# Patient Record
Sex: Male | Born: 1935 | Race: White | Hispanic: No | Smoking: Former smoker
Health system: Southern US, Community
[De-identification: ages and names within clinical notes are randomized; demographics above are authoritative.]

## PROBLEM LIST (undated history)

## (undated) DIAGNOSIS — F419 Anxiety disorder, unspecified: Secondary | ICD-10-CM

## (undated) DIAGNOSIS — G20A1 Parkinson's disease without dyskinesia, without mention of fluctuations: Secondary | ICD-10-CM

## (undated) DIAGNOSIS — M8580 Other specified disorders of bone density and structure, unspecified site: Secondary | ICD-10-CM

## (undated) DIAGNOSIS — N289 Disorder of kidney and ureter, unspecified: Secondary | ICD-10-CM

## (undated) DIAGNOSIS — G20 Parkinson's disease: Secondary | ICD-10-CM

## (undated) DIAGNOSIS — M5416 Radiculopathy, lumbar region: Secondary | ICD-10-CM

## (undated) DIAGNOSIS — I509 Heart failure, unspecified: Secondary | ICD-10-CM

## (undated) DIAGNOSIS — I10 Essential (primary) hypertension: Secondary | ICD-10-CM

## (undated) HISTORY — PX: ROTATOR CUFF REPAIR: SHX139

## (undated) HISTORY — DX: Radiculopathy, lumbar region: M54.16

## (undated) HISTORY — PX: CHOLECYSTECTOMY: SHX55

## (undated) HISTORY — DX: Other specified disorders of bone density and structure, unspecified site: M85.80

## (undated) HISTORY — PX: OTHER SURGICAL HISTORY: SHX169

---

## 1997-08-14 ENCOUNTER — Other Ambulatory Visit: Admission: RE | Admit: 1997-08-14 | Discharge: 1997-08-14 | Payer: Self-pay | Admitting: Internal Medicine

## 1998-03-26 ENCOUNTER — Ambulatory Visit (HOSPITAL_COMMUNITY): Admission: RE | Admit: 1998-03-26 | Discharge: 1998-03-26 | Payer: Self-pay | Admitting: Orthopedic Surgery

## 1998-03-26 ENCOUNTER — Encounter: Payer: Self-pay | Admitting: Orthopedic Surgery

## 1998-04-19 ENCOUNTER — Encounter: Payer: Self-pay | Admitting: Orthopedic Surgery

## 1998-04-23 ENCOUNTER — Inpatient Hospital Stay (HOSPITAL_COMMUNITY): Admission: RE | Admit: 1998-04-23 | Discharge: 1998-04-25 | Payer: Self-pay | Admitting: Orthopedic Surgery

## 2000-07-23 ENCOUNTER — Encounter: Admission: RE | Admit: 2000-07-23 | Discharge: 2000-07-23 | Payer: Self-pay | Admitting: Internal Medicine

## 2000-07-23 ENCOUNTER — Encounter: Payer: Self-pay | Admitting: Internal Medicine

## 2003-02-02 ENCOUNTER — Encounter: Admission: RE | Admit: 2003-02-02 | Discharge: 2003-02-02 | Payer: Self-pay | Admitting: Orthopaedic Surgery

## 2004-02-18 ENCOUNTER — Ambulatory Visit (HOSPITAL_COMMUNITY): Admission: RE | Admit: 2004-02-18 | Discharge: 2004-02-18 | Payer: Self-pay | Admitting: Neurology

## 2004-03-07 ENCOUNTER — Ambulatory Visit (HOSPITAL_COMMUNITY): Admission: RE | Admit: 2004-03-07 | Discharge: 2004-03-07 | Payer: Self-pay | Admitting: Neurology

## 2005-06-17 ENCOUNTER — Ambulatory Visit (HOSPITAL_COMMUNITY): Admission: RE | Admit: 2005-06-17 | Discharge: 2005-06-17 | Payer: Self-pay | Admitting: Neurology

## 2007-11-21 ENCOUNTER — Encounter: Admission: RE | Admit: 2007-11-21 | Discharge: 2007-12-30 | Payer: Self-pay | Admitting: Neurology

## 2008-07-05 ENCOUNTER — Encounter: Admission: RE | Admit: 2008-07-05 | Discharge: 2008-07-05 | Payer: Self-pay | Admitting: Geriatric Medicine

## 2008-07-10 ENCOUNTER — Encounter: Admission: RE | Admit: 2008-07-10 | Discharge: 2008-07-10 | Payer: Self-pay | Admitting: Geriatric Medicine

## 2008-07-26 ENCOUNTER — Ambulatory Visit (HOSPITAL_COMMUNITY): Admission: RE | Admit: 2008-07-26 | Discharge: 2008-07-26 | Payer: Self-pay | Admitting: Urology

## 2008-10-29 ENCOUNTER — Encounter (INDEPENDENT_AMBULATORY_CARE_PROVIDER_SITE_OTHER): Payer: Self-pay | Admitting: Surgery

## 2008-10-29 ENCOUNTER — Ambulatory Visit (HOSPITAL_COMMUNITY): Admission: RE | Admit: 2008-10-29 | Discharge: 2008-10-30 | Payer: Self-pay | Admitting: Surgery

## 2010-04-27 IMAGING — CR DG CHEST 2V
2 series · 2 of 2 positions shown · non-contrast
Comparison: None

CLINICAL DATA: Preop gallstones.

CHEST - 2 VIEW

[w chest pa]
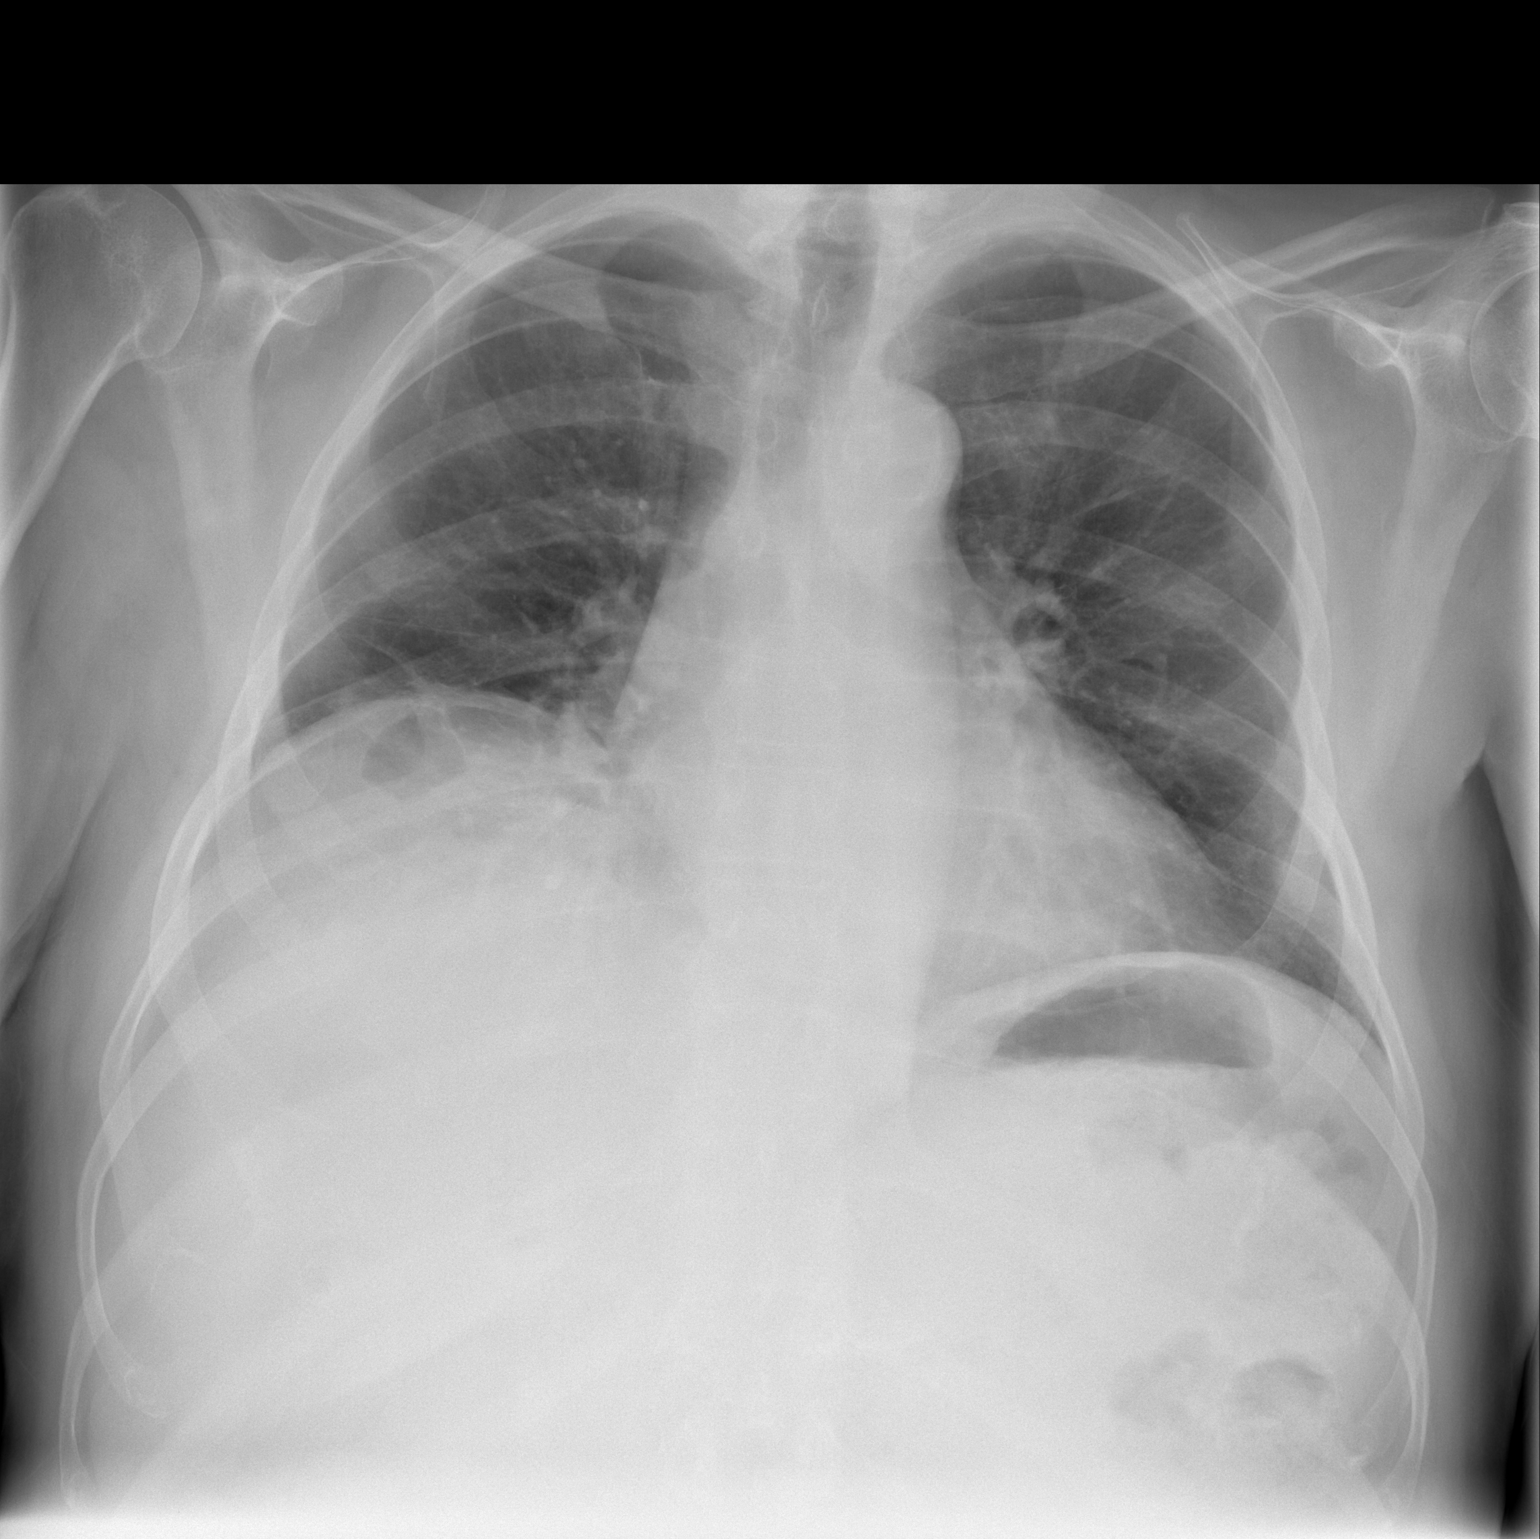

[w chest lat]
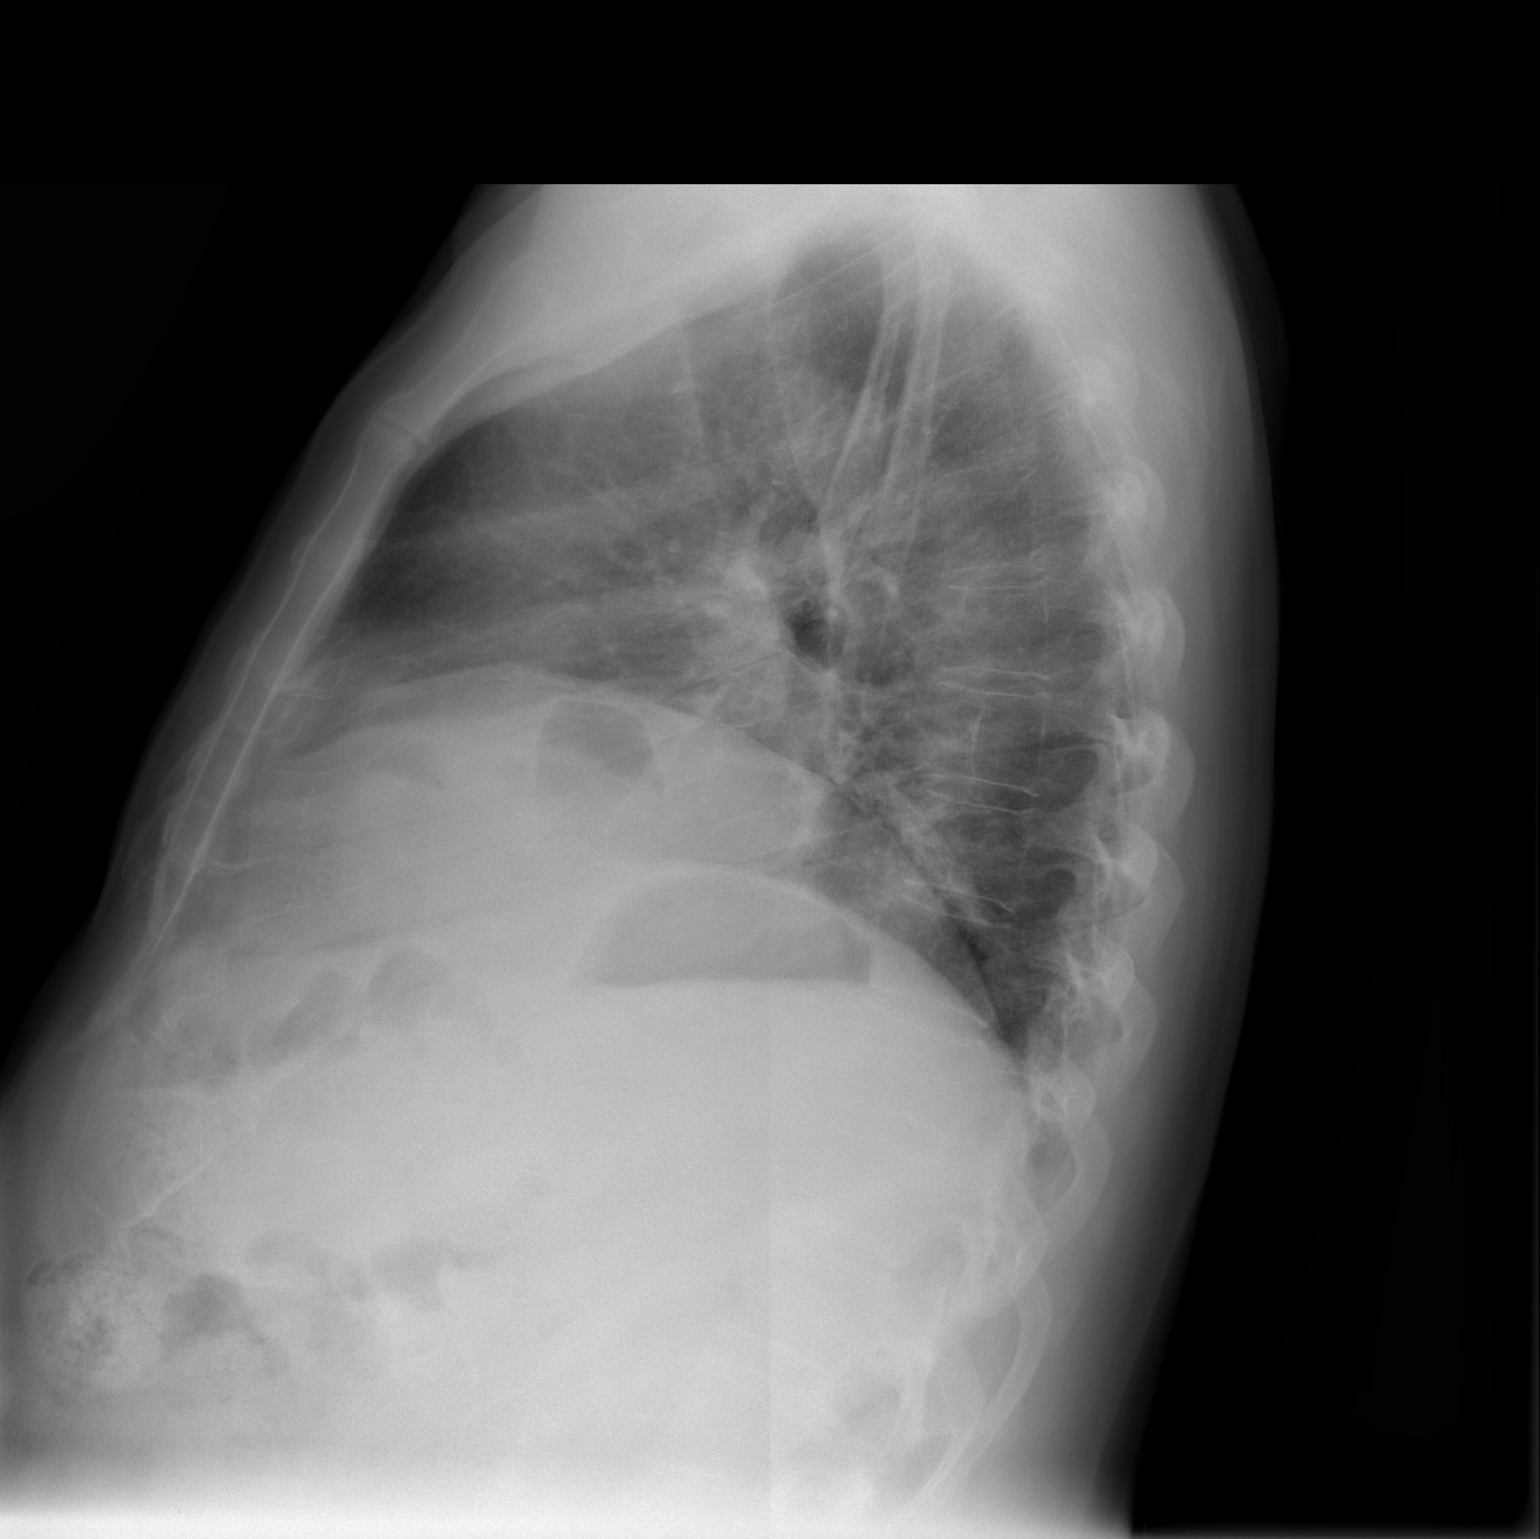

[2 of 2 positions shown; findings below may reference images not displayed]

FINDINGS: There are low lung volumes.  Mild elevation of the right
hemidiaphragm with right base atelectasis.  Heart is mildly
enlarged.  No effusions or acute bony abnormality.
IMPRESSION: Low lung volumes, right base atelectasis.

Mild cardiomegaly.

## 2010-07-13 LAB — COMPREHENSIVE METABOLIC PANEL
AST: 20 U/L (ref 0–37)
Albumin: 3.8 g/dL (ref 3.5–5.2)
Chloride: 101 mEq/L (ref 96–112)
Creatinine, Ser: 1.35 mg/dL (ref 0.4–1.5)
GFR calc Af Amer: >60 mL/min (ref >60)
Total Bilirubin: 0.5 mg/dL (ref 0.3–1.2)
Total Protein: 7.1 g/dL (ref 6.0–8.3)

## 2010-07-13 LAB — HEMOGLOBIN A1C
Hgb A1c MFr Bld: 7.6 % — ABNORMAL HIGH (ref 4.6–6.1)
Mean Plasma Glucose: 171 mg/dL

## 2010-07-13 LAB — DIFFERENTIAL
Eosinophils Relative: 2 % (ref 0–5)
Lymphocytes Relative: 17 % (ref 12–46)
Lymphs Abs: 1.4 10*3/uL (ref 0.7–4.0)
Monocytes Absolute: 0.5 10*3/uL (ref 0.1–1.0)
Monocytes Relative: 6 % (ref 3–12)

## 2010-07-13 LAB — GLUCOSE, CAPILLARY
Glucose-Capillary: 217 mg/dL — ABNORMAL HIGH (ref 70–99)
Glucose-Capillary: 233 mg/dL — ABNORMAL HIGH (ref 70–99)

## 2010-07-13 LAB — CBC
MCV: 85.5 fL (ref 78.0–100.0)
Platelets: 259 10*3/uL (ref 150–400)
RDW: 15.6 % — ABNORMAL HIGH (ref 11.5–15.5)
WBC: 8 10*3/uL (ref 4.0–10.5)

## 2010-07-16 LAB — CREATININE, SERUM
Creatinine, Ser: 1.26 mg/dL (ref 0.4–1.5)
GFR calc non Af Amer: 55 mL/min — ABNORMAL LOW (ref >60)

## 2010-08-19 NOTE — Op Note (Signed)
Andrew Dunn, Andrew Dunn               ACCOUNT NO.:  [id number]   MEDICAL RECORD NO.:  [id number]          PATIENT TYPE:  OIB   LOCATION:  0098                         FACILITY:  [facility]   PHYSICIAN:  Clovis Pu. Cornett, M.D.DATE OF BIRTH:  11/01/1925   DATE OF PROCEDURE:  10/29/2008  DATE OF DISCHARGE:                               OPERATIVE REPORT   PREOPERATIVE DIAGNOSIS:  Symptomatic cholelithiasis.   PROCEDURE:  Laparoscopic cholecystectomy with intraoperative  cholangiogram.   SURGEON:  Harriette Bouillon, MD.   ASSISTANT:  OR staff.   ANESTHESIA:  General endotracheal anesthesia with 0.25% Sensorcaine  local.   ESTIMATED BLOOD LOSS:  50 mL.   DRAINS:  None.   SPECIMEN:  Gallbladder with large gallstone to pathology.   INDICATIONS FOR PROCEDURE:  The patient is an 77 year old male with  symptomatic cholelithiasis.  He presents for laparoscopic  cholecystectomy due to chronic attacks of pain.   DESCRIPTION OF PROCEDURE:  The patient was brought to the operating room  and placed supine.  After induction of general anesthesia the abdomen  was prepped and draped in sterile fashion.  A 1-cm supraumbilical  incision was made.  Dissection was carried down to his fascia and the  fascia was opened with a scalpel blade.  Kochers were used to hold the  fascia.  This was the preperitoneal space and I used a small Kelly clamp  which spread up the peritoneum to enter the abdominal cavity under  direct vision.  A pursestring suture of 0-Vicryl was placed and a 12-mm  Hassan cannula was placed under direct vision.  Pneumoperitoneum was  created with 15 mmHg CO2 and laparoscope was placed.  The patient was  placed in reverse Trendelenburg and rolled to his left.  He had a very  large omentum and colon; this was up over the liver.  It took some  manipulation to pull this down to visualize the liver edge which we  eventually did.  An 11-mm subxiphoid port was placed under direct vision  and two  5-mL ports were placed in the right upper quadrant without  difficulty.  Gallbladder was identified and was quite small and had  signs of chronic inflammation.  We grabbed it by its dome and tried to  push it above the liver edge.  Unfortunately due to large amount of  mesenteric fat we could not see it and so I placed an additional left  midabdominal 5-mm port and placed a small grasper so I could pull down  on the omentum, thus exposing the infundibulum much better.  I was able  to grab the infundibulum, but the gallbladder was very thin and paper-  like toward the infundibulum, spilling a small amount of bile but no  stones.  I was able to grab the gallbladder and pull toward the  patient's right lower quadrant and dissect away the cystic artery and  subsequent cystic duct.  Two clips were placed in the bottom part of  cystic artery and a clip was placed above; this was divided.  This  helped [place] to see the cystic duct much better.  I was able to get  completely around this and get a clip on the gallbladder side.  A small  incision was made in the cystic duct and the cholangiogram catheter was  introduced through it.  Intraoperative cholangiogram was performed using  0.5% Hypaque dye and fluoroscopy.  We had a small amount of gallbladder  just below where our catheter was and a very long tortuous cystic duct  that flowed into the common duct with free flow of contrast in the  duodenum up the common hepatic duct into right and left ductules.  No  evidence of stones or small extravasation from the catheter insertion  site.  I removed the catheter at this point and slid my clips below it  get the very bottom portion of what appeared to be a little bit of  retained gallbladder across the true cystic duct.  I then excised this  excess tissue above the clips with the specimen as the gallbladder.  We  then used cautery to dissect the gallbladder and gallbladder fossa.  His  liver was quite soft  and there was some oozing from liver bed.  After  extraction of the gallbladder from the liver bed this was placed in an  EndoCatch bag.  We used cautery to control this with Surgicel to control  some oozing from liver edge and we were able to do so.  There was a  small posterior branch of the cystic artery that I controlled with clips  as well.  After irrigation was used and the Surgicel was in place the  gallbladder was extracted through the umbilicus and passed off the  field.  Closure of this port site was done under direct vision with a  pursestring suture of 0-Vicryl.  I reinspected the gallbladder bed and  saw no evidence of bleeding or bile leakage.  We saw no evidence of  bleeding or bile leakage.  We saw no injury to the colon, small bowel,  stomach, and is no signs of any bleeding from the port sites.  At this  point in time after checking the gallbladder we removed our ports,  allowing the CO2 to escape.  All ports were passed off the field.  We  close skin incisions with 4-0 Monocryl and Dermabond was applied.  All  final counts of sponges, needles and instruments were found to correct  at this portion case.  The patient was awakened and taken to the  recovery room in satisfactory condition.      Thomas A. Cornett, M.D.  Electronically Signed     TAC/MEDQ  D:  10/29/2008  T:  10/29/2008  Job:  [id number]

## 2010-08-22 NOTE — Op Note (Signed)
NAMEABELINO, TIPPIN               ACCOUNT NO.:  [id number]   MEDICAL RECORD NO.:  [id number]          PATIENT TYPE:  OUT   LOCATION:  MDC                          FACILITY:  [facility]   PHYSICIAN:  Genene Churn. Love, M.D.    DATE OF BIRTH:  [birth year]   DATE OF PROCEDURE:  03/07/2004  DATE OF DISCHARGE:                                 OPERATIVE REPORT   PROCEDURE:  Patient was prepped and draped in the usual sterile manner with  Betadine and 1% Xylocaine. The L4-L5 interspace was entered without  difficulty.  Entering pressure was 100 mmH20 and clear color CSF was  obtained.  Thirty-five cc was removed.  The patient had a timed stand, walk,  sit, gait between 2 chairs, 20 feet apart.  The time before was 148 seconds,  the time afterwards was 2 minutes.   IMPRESSION:  No improvement as the result of spinal tap.       JML/MEDQ  D:  03/07/2004  T:  03/07/2004  Job:  [id number]

## 2010-11-25 ENCOUNTER — Other Ambulatory Visit: Payer: Self-pay | Admitting: Internal Medicine

## 2010-11-25 DIAGNOSIS — M5416 Radiculopathy, lumbar region: Secondary | ICD-10-CM

## 2010-11-27 ENCOUNTER — Ambulatory Visit
Admission: RE | Admit: 2010-11-27 | Discharge: 2010-11-27 | Disposition: A | Discharge status: Home / Self Care | Payer: Medicare Other | Source: Ambulatory Visit | Attending: Internal Medicine | Admitting: Internal Medicine

## 2010-11-27 DIAGNOSIS — M5416 Radiculopathy, lumbar region: Secondary | ICD-10-CM

## 2011-04-13 ENCOUNTER — Other Ambulatory Visit: Payer: Self-pay

## 2011-04-13 ENCOUNTER — Encounter: Payer: Self-pay | Admitting: Adult Health

## 2011-04-13 ENCOUNTER — Emergency Department (HOSPITAL_COMMUNITY): Payer: Medicare Other

## 2011-04-13 ENCOUNTER — Emergency Department (HOSPITAL_COMMUNITY)
Admission: EM | Admit: 2011-04-13 | Discharge: 2011-04-13 | Disposition: A | Discharge status: Home / Self Care | Payer: Medicare Other | Attending: Emergency Medicine | Admitting: Emergency Medicine

## 2011-04-13 DIAGNOSIS — I6789 Other cerebrovascular disease: Secondary | ICD-10-CM | POA: Insufficient documentation

## 2011-04-13 DIAGNOSIS — F0280 Dementia in other diseases classified elsewhere without behavioral disturbance: Secondary | ICD-10-CM | POA: Insufficient documentation

## 2011-04-13 DIAGNOSIS — G3183 Dementia with Lewy bodies: Secondary | ICD-10-CM | POA: Insufficient documentation

## 2011-04-13 DIAGNOSIS — R51 Headache: Secondary | ICD-10-CM | POA: Insufficient documentation

## 2011-04-13 DIAGNOSIS — I10 Essential (primary) hypertension: Secondary | ICD-10-CM | POA: Insufficient documentation

## 2011-04-13 DIAGNOSIS — Y92009 Unspecified place in unspecified non-institutional (private) residence as the place of occurrence of the external cause: Secondary | ICD-10-CM | POA: Insufficient documentation

## 2011-04-13 DIAGNOSIS — Q619 Cystic kidney disease, unspecified: Secondary | ICD-10-CM | POA: Insufficient documentation

## 2011-04-13 DIAGNOSIS — E119 Type 2 diabetes mellitus without complications: Secondary | ICD-10-CM | POA: Insufficient documentation

## 2011-04-13 DIAGNOSIS — R319 Hematuria, unspecified: Secondary | ICD-10-CM | POA: Insufficient documentation

## 2011-04-13 DIAGNOSIS — W19XXXA Unspecified fall, initial encounter: Secondary | ICD-10-CM | POA: Insufficient documentation

## 2011-04-13 DIAGNOSIS — R109 Unspecified abdominal pain: Secondary | ICD-10-CM | POA: Insufficient documentation

## 2011-04-13 DIAGNOSIS — Z9089 Acquired absence of other organs: Secondary | ICD-10-CM | POA: Insufficient documentation

## 2011-04-13 DIAGNOSIS — T1490XA Injury, unspecified, initial encounter: Secondary | ICD-10-CM | POA: Insufficient documentation

## 2011-04-13 HISTORY — DX: Parkinson's disease: G20

## 2011-04-13 HISTORY — DX: Essential (primary) hypertension: I10

## 2011-04-13 HISTORY — DX: Anxiety disorder, unspecified: F41.9

## 2011-04-13 HISTORY — DX: Disorder of kidney and ureter, unspecified: N28.9

## 2011-04-13 HISTORY — DX: Parkinson's disease without dyskinesia, without mention of fluctuations: G20.A1

## 2011-04-13 LAB — CBC
HCT: 35 % — ABNORMAL LOW (ref 39.0–52.0)
MCH: 27.1 pg (ref 26.0–34.0)
MCHC: 32.9 g/dL (ref 30.0–36.0)
RDW: 15 % (ref 11.5–15.5)

## 2011-04-13 LAB — BASIC METABOLIC PANEL
CO2: 30 mEq/L (ref 19–32)
Calcium: 9 mg/dL (ref 8.4–10.5)
Creatinine, Ser: 1.36 mg/dL — ABNORMAL HIGH (ref 0.50–1.35)
GFR calc non Af Amer: 46 mL/min — ABNORMAL LOW (ref >90)
Glucose, Bld: 201 mg/dL — ABNORMAL HIGH (ref 70–99)
Sodium: 140 mEq/L (ref 135–145)

## 2011-04-13 LAB — GLUCOSE, CAPILLARY: Glucose-Capillary: 203 mg/dL — ABNORMAL HIGH (ref 70–99)

## 2011-04-13 LAB — CARDIAC PANEL(CRET KIN+CKTOT+MB+TROPI)
CK, MB: 4.7 ng/mL — ABNORMAL HIGH (ref 0.3–4.0)
Relative Index: 2.5 (ref 0.0–2.5)
Total CK: 187 U/L (ref 7–232)
Troponin I: <0.3 ng/mL (ref <0.30)

## 2011-04-13 LAB — URINALYSIS, ROUTINE W REFLEX MICROSCOPIC
Protein, ur: 100 mg/dL — AB
Urobilinogen, UA: 0.2 mg/dL (ref 0.0–1.0)

## 2011-04-13 LAB — URINE MICROSCOPIC-ADD ON

## 2011-04-13 MED ORDER — SODIUM CHLORIDE 0.9 % IV BOLUS (SEPSIS)
1000.0000 mL | Freq: Once | INTRAVENOUS | Status: AC
  Administered 2011-04-13: 1000 mL via INTRAVENOUS

## 2011-04-13 NOTE — ED Notes (Signed)
unwitnessed fall at home. Family found pt lying on floor, no complaints of pain, no visible injuries. Pt does not know how he fell, nor does he remember fall. At this time alert and oriented to self. Unable to state year or month. History of dementia

## 2011-04-13 NOTE — ED Notes (Signed)
Patient is resting comfortably. 

## 2011-04-13 NOTE — ED Notes (Signed)
CBG 203 

## 2011-04-13 NOTE — ED Notes (Signed)
Family at bedside. 

## 2011-04-13 NOTE — ED Provider Notes (Signed)
History     CSN: [id number]  Arrival date & time 04/13/11  4098   First MD Initiated Contact with Patient 04/13/11 1001      Chief Complaint  Patient presents with  . Fall    (Consider location/radiation/quality/duration/timing/severity/associated sxs/prior treatment) HPI Patient is an 78 yo M with history of dementia and Parkinson's Disease who lives at home with family next door to help and daily in home assistance.  This morning when his home care assistant came she was unable to get in the home.  His daughter who lives next door was unable to unlock the door and the patient was found awake and at his neurologic baseline on the floor.  EMS was called for lifting assistance and the patient was brought in based on their encouragement.  The patient has had increasing difficulty with memory over the past month as well as increasing urinary incontinence.  There have been no difficulties with gait and normally uses a walker.  Patient denies any symptoms and family reports that the patient is at his neurologic baseline. Past Medical History  Diagnosis Date  . Hypertension   . Diabetes mellitus   . Anxiety   . Renal disorder   . Parkinson disease     History reviewed. No pertinent past surgical history.  History reviewed. No pertinent family history.  History  Substance Use Topics  . Smoking status: Not on file  . Smokeless tobacco: Not on file  . Alcohol Use: No      Review of Systems  Unable to perform ROS: Dementia    Allergies  Review of patient's allergies indicates no known allergies.  Home Medications   Current Outpatient Rx  Name Route Sig Dispense Refill  . ASPIRIN EC 81 MG PO TBEC Oral Take 81 mg by mouth daily.      Marland Kitchen CARBIDOPA-LEVODOPA 25-250 MG PO TABS Oral Take 1 tablet by mouth 3 (three) times daily.      Marland Kitchen DIAZEPAM 5 MG PO TABS Oral Take 5 mg by mouth every 6 (six) hours as needed. For anxiety    . DOXAZOSIN MESYLATE 8 MG PO TABS Oral Take 8 mg by mouth  at bedtime.      . ENALAPRIL MALEATE 10 MG PO TABS Oral Take 10 mg by mouth 2 (two) times daily.      . FUROSEMIDE 40 MG PO TABS Oral Take 40 mg by mouth daily.      . GLYBURIDE 5 MG PO TABS Oral Take 10 mg by mouth 2 (two) times daily with a meal.      . METFORMIN HCL 500 MG PO TABS Oral Take 1,000 mg by mouth 2 (two) times daily with a meal.      . ADULT MULTIVITAMIN W/MINERALS CH Oral Take 1 tablet by mouth daily.      Marland Kitchen NAPROXEN SODIUM 220 MG PO TABS Oral Take 440 mg by mouth at bedtime.      Marland Kitchen VITAMIN D (ERGOCALCIFEROL) 50000 UNITS PO CAPS Oral Take 50,000 Units by mouth every 14 (fourteen) days.       BP 154/74  Pulse 83  Temp(Src) 98.5 F (36.9 C) (Oral)  Resp 24  SpO2 94%  Physical Exam  Nursing note and vitals reviewed. Constitutional: He is oriented to person, place, and time. He appears well-developed and well-nourished. No distress.  HENT:  Head: Normocephalic and atraumatic.  Eyes: Conjunctivae and EOM are normal. Pupils are equal, round, and reactive to light.  Neck: Normal range  of motion.  Cardiovascular: Normal rate, regular rhythm, normal heart sounds and intact distal pulses.  Exam reveals no gallop and no friction rub.   No murmur heard. Pulmonary/Chest: Effort normal and breath sounds normal. No respiratory distress. He has no wheezes. He has no rales.  Abdominal: Soft. Bowel sounds are normal. He exhibits no distension. There is no tenderness. There is no rebound and no guarding.  Musculoskeletal: Normal range of motion. He exhibits no edema and no tenderness.  Neurological: He is alert and oriented to person, place, and time. No cranial nerve deficit. Coordination normal.  Skin: Skin is warm and dry. No rash noted.  Psychiatric: He has a normal mood and affect.    ED Course  Procedures (including critical care time)  Labs Reviewed  GLUCOSE, CAPILLARY - Abnormal; Notable for the following:    Glucose-Capillary 203 (*)    All other components within normal  limits  CBC - Abnormal; Notable for the following:    WBC 11.3 (*)    Hemoglobin 11.5 (*)    HCT 35.0 (*)    All other components within normal limits  URINALYSIS, ROUTINE W REFLEX MICROSCOPIC - Abnormal; Notable for the following:    Glucose, UA 100 (*)    Hgb urine dipstick LARGE (*)    Ketones, ur TRACE (*)    Protein, ur 100 (*)    All other components within normal limits  BASIC METABOLIC PANEL - Abnormal; Notable for the following:    Glucose, Bld 201 (*)    BUN 25 (*)    Creatinine, Ser 1.36 (*)    GFR calc non Af Amer 46 (*)    GFR calc Af Amer 54 (*)    All other components within normal limits  URINE MICROSCOPIC-ADD ON - Abnormal; Notable for the following:    Bacteria, UA FEW (*)    Casts HYALINE CASTS (*)    All other components within normal limits  CARDIAC PANEL(CRET KIN+CKTOT+MB+TROPI) - Abnormal; Notable for the following:    CK, MB 4.7 (*)    All other components within normal limits  POCT CBG MONITORING   Ct Abdomen Pelvis Wo Contrast  04/13/2011  *RADIOLOGY REPORT*  Clinical Data: Hematuria.  Lower abdominal pain.  CT ABDOMEN AND PELVIS WITHOUT CONTRAST  Technique:  Multidetector CT imaging of the abdomen and pelvis was performed following the standard protocol without intravenous contrast.  Comparison: None.  Findings: Mild atelectasis noted in the right lower lobe and right middle lobe.  A left thoracic lipoma extending between the seventh and eighth ribs posterolaterally extends into the pleural space and measures 3.4 x 2.8 cm on image 16 of series 2.  Coronary artery atherosclerotic calcification involving the left anterior descending and left main coronary arteries noted.  There is also calcification of the right coronary artery and aortic valve.  The gallbladder is surgically absent.  A peripheral fluid density lesion of the right kidney measures 5.0 x 4.2 cm on image 51 of series 2, and a separate 1.5 x 1.1 cm hypodense lesion is present along the right renal  collecting system on image 46 of series 2.  The umbilical hernia contains adipose tissue.  Mild perirenal lipomatosis noted.  No renal or ureteral calculus is observed.  Atherosclerosis of the abdominal aorta is present.  No dilated bowel noted.  Mild urinary bladder wall thickening is observed diffusely.  There is considerable prominence the prostate gland, asymmetric to the left, indenting the urinary bladder.  Small bilateral inguinal  hernias contain adipose tissue.  There is grade 1 degenerative anterior subluxation of L4 on L5 secondary to facet arthropathy.  There is also facet arthropathy at L5-S1.  Degenerative disc disease noted at L4-5.  IMPRESSION:  1.  Prominence of prostate gland eccentric to the left.  Wall thickening in the urinary bladder could be secondary to chronic outlet obstruction or cystitis. 2.  Mild perirenal lipomatosis. 3.  There is also a lipoma of the left posterior chest wall which extends into the pleural space.  4.  Mild atelectasis in the right lower lobe and right middle lobe, with mildly elevated right hemidiaphragm. 5.  Atherosclerosis. 6.  Right renal cysts. 7.  Lower lumbar spondylosis and degenerative disc disease. 8.  Umbilical and small bilateral inguinal hernias contain adipose tissue.  Original Report Authenticated By: Dellia Cloud, M.D.   Ct Head Wo Contrast  04/13/2011  *RADIOLOGY REPORT*  Clinical Data: Fall.  Head trauma.  Headache.  CT HEAD WITHOUT CONTRAST  Technique:  Contiguous axial images were obtained from the base of the skull through the vertex without contrast.  Comparison: None.  Findings: The brain shows generalized atrophy.  There are extensive chronic small vessel changes throughout the hemispheric white matter.  The ventricles are prominent, presumably due to ex vacuo enlargement.  No sign of acute infarction, mass lesion, hemorrhage or extra-axial collection.  No suspicion of obstructive hydrocephalus.  There is physiologic calcification of the  basal ganglia.  There is atherosclerotic calcification of the major vessels at the base the brain.  No skull fracture.  No fluid in the sinuses, middle ears or mastoids.  IMPRESSION: No acute or traumatic finding.  The brain atrophy and extensive chronic small vessel disease.  Ex vacuo enlargement of the lateral ventricles due to central atrophy.  Original Report Authenticated By: Thomasenia Sales, M.D.    Date: 04/13/2011  Rate: 89  Rhythm: normal sinus rhythm  QRS Axis: normal  Intervals: normal  ST/T Wave abnormalities: nonspecific T wave changes  Conduction Disutrbances:none  Narrative Interpretation:   Old EKG Reviewed: none available   1. Fall       MDM  Patient was evaluated and patient's family was at bedside.  They reported that the patient was mainly brought today based on the encouragement provided to the patient by EMS.  Patient's family was comfortable with minimizing work-up.  Patient did have an EKG and a TNI ordered.  Patient had no respiratory symptoms and denied chest pain or shortness of breath.  Family agreed to work-up with head CT, basic labs, UA, and EKG.  CT head was unremarkable.  Patient had elevated BUN/Cr ratio but family preferred not to have IVF.  UA was positive for hematuria and patient did not have prior history of this.  Patient had unremarkable ECG.  Nursing staff replaced TNI order with cardiac enzymes in lab based on blood collected.  The patient had CKMB of 4.7 but no abnormal index.  Troponin was negative.  Family was notified and did not wish to have repeat testing.  Patient remained hemodynamically stable.  UA was positive for blood and CT of the abdomen was recommended given that this was a new finding.  There was thickening of the bladder which was shared with the family but no masses or nephrolithiasis.  They will follow-up with the PCP.  Patient was able to ambulate at baseline level and was discharged in good condition.     Cyndra Numbers, MD 04/13/11  2118

## 2011-04-13 NOTE — ED Notes (Signed)
Patient transported to CT 

## 2011-04-13 NOTE — ED Notes (Signed)
[id number]<AV> Expected date:<BR> Expected time:<BR> Means of arrival:<BR> Comments:<BR> ems/fall

## 2011-05-17 ENCOUNTER — Emergency Department (HOSPITAL_COMMUNITY): Payer: Medicare Other

## 2011-05-17 ENCOUNTER — Other Ambulatory Visit: Payer: Self-pay

## 2011-05-17 ENCOUNTER — Inpatient Hospital Stay (HOSPITAL_COMMUNITY)
Admission: EM | Admit: 2011-05-17 | Discharge: 2011-05-20 | DRG: 057 | Disposition: A | Discharge status: Skilled Nursing Facility | Payer: Medicare Other | Attending: Internal Medicine | Admitting: Internal Medicine

## 2011-05-17 ENCOUNTER — Encounter (HOSPITAL_COMMUNITY): Payer: Self-pay | Admitting: Adult Health

## 2011-05-17 DIAGNOSIS — D510 Vitamin B12 deficiency anemia due to intrinsic factor deficiency: Secondary | ICD-10-CM | POA: Diagnosis present

## 2011-05-17 DIAGNOSIS — R627 Adult failure to thrive: Secondary | ICD-10-CM | POA: Diagnosis present

## 2011-05-17 DIAGNOSIS — F05 Delirium due to known physiological condition: Secondary | ICD-10-CM | POA: Diagnosis present

## 2011-05-17 DIAGNOSIS — R609 Edema, unspecified: Secondary | ICD-10-CM | POA: Diagnosis present

## 2011-05-17 DIAGNOSIS — Z7982 Long term (current) use of aspirin: Secondary | ICD-10-CM

## 2011-05-17 DIAGNOSIS — R4182 Altered mental status, unspecified: Secondary | ICD-10-CM

## 2011-05-17 DIAGNOSIS — R410 Disorientation, unspecified: Secondary | ICD-10-CM | POA: Diagnosis present

## 2011-05-17 DIAGNOSIS — G20A1 Parkinson's disease without dyskinesia, without mention of fluctuations: Secondary | ICD-10-CM | POA: Diagnosis present

## 2011-05-17 DIAGNOSIS — I129 Hypertensive chronic kidney disease with stage 1 through stage 4 chronic kidney disease, or unspecified chronic kidney disease: Secondary | ICD-10-CM | POA: Diagnosis present

## 2011-05-17 DIAGNOSIS — Z79899 Other long term (current) drug therapy: Secondary | ICD-10-CM

## 2011-05-17 DIAGNOSIS — E876 Hypokalemia: Secondary | ICD-10-CM | POA: Diagnosis not present

## 2011-05-17 DIAGNOSIS — F0390 Unspecified dementia without behavioral disturbance: Secondary | ICD-10-CM | POA: Diagnosis present

## 2011-05-17 DIAGNOSIS — Z9181 History of falling: Secondary | ICD-10-CM

## 2011-05-17 DIAGNOSIS — R32 Unspecified urinary incontinence: Secondary | ICD-10-CM | POA: Diagnosis present

## 2011-05-17 DIAGNOSIS — G309 Alzheimer's disease, unspecified: Secondary | ICD-10-CM | POA: Diagnosis present

## 2011-05-17 DIAGNOSIS — E559 Vitamin D deficiency, unspecified: Secondary | ICD-10-CM | POA: Diagnosis present

## 2011-05-17 DIAGNOSIS — F411 Generalized anxiety disorder: Secondary | ICD-10-CM | POA: Diagnosis present

## 2011-05-17 DIAGNOSIS — F0280 Dementia in other diseases classified elsewhere without behavioral disturbance: Principal | ICD-10-CM | POA: Diagnosis present

## 2011-05-17 DIAGNOSIS — R269 Unspecified abnormalities of gait and mobility: Secondary | ICD-10-CM | POA: Diagnosis present

## 2011-05-17 DIAGNOSIS — N183 Chronic kidney disease, stage 3 unspecified: Secondary | ICD-10-CM | POA: Diagnosis present

## 2011-05-17 DIAGNOSIS — E119 Type 2 diabetes mellitus without complications: Secondary | ICD-10-CM | POA: Diagnosis present

## 2011-05-17 DIAGNOSIS — G20 Parkinson's disease: Secondary | ICD-10-CM | POA: Diagnosis present

## 2011-05-17 LAB — CBC
HCT: 33 % — ABNORMAL LOW (ref 39.0–52.0)
Hemoglobin: 10.7 g/dL — ABNORMAL LOW (ref 13.0–17.0)
MCH: 27 pg (ref 26.0–34.0)
MCHC: 32.4 g/dL (ref 30.0–36.0)
RDW: 14.6 % (ref 11.5–15.5)

## 2011-05-17 LAB — DIFFERENTIAL
Basophils Relative: 1 % (ref 0–1)
Eosinophils Absolute: 0.2 10*3/uL (ref 0.0–0.7)
Monocytes Absolute: 0.7 10*3/uL (ref 0.1–1.0)
Monocytes Relative: 8 % (ref 3–12)

## 2011-05-17 LAB — COMPREHENSIVE METABOLIC PANEL
Albumin: 3 g/dL — ABNORMAL LOW (ref 3.5–5.2)
BUN: 19 mg/dL (ref 6–23)
Calcium: 9.1 mg/dL (ref 8.4–10.5)
Creatinine, Ser: 1.36 mg/dL — ABNORMAL HIGH (ref 0.50–1.35)
Total Protein: 6.6 g/dL (ref 6.0–8.3)

## 2011-05-17 LAB — URINE MICROSCOPIC-ADD ON

## 2011-05-17 LAB — URINALYSIS, ROUTINE W REFLEX MICROSCOPIC
Glucose, UA: 500 mg/dL — AB
Leukocytes, UA: NEGATIVE
Protein, ur: 100 mg/dL — AB
Specific Gravity, Urine: 1.014 (ref 1.005–1.030)
pH: 6.5 (ref 5.0–8.0)

## 2011-05-17 LAB — GLUCOSE, CAPILLARY: Glucose-Capillary: 353 mg/dL — ABNORMAL HIGH (ref 70–99)

## 2011-05-17 MED ORDER — SODIUM CHLORIDE 0.9 % IV SOLN
INTRAVENOUS | Status: AC
  Administered 2011-05-18: via INTRAVENOUS

## 2011-05-17 NOTE — ED Notes (Addendum)
Found by a neighbor this afternoon with no clothes on standing outside, does not know who his daughter is for one week. Unable to answer questions appropriately. Oriented to self, alert. PERRLA. CBG 353

## 2011-05-17 NOTE — ED Provider Notes (Signed)
History     CSN: [id number]  Arrival date & time 05/17/11  1640   First MD Initiated Contact with Patient 05/17/11 1836      Chief Complaint  Patient presents with  . Altered Mental Status    (Consider location/radiation/quality/duration/timing/severity/associated sxs/prior treatment) HPI Comments: Patient with increased confusion, dementia over the past 8 months.  He has been falling, wandering away from home.  Today patient was outside wandering naked.  He denies any complaints.  No fevers or chills.  Patient is a 78 y.o. male presenting with altered mental status. The history is provided by the patient.  Altered Mental Status This is a chronic problem. The problem occurs constantly. The problem has been gradually worsening.    Past Medical History  Diagnosis Date  . Hypertension   . Diabetes mellitus   . Anxiety   . Renal disorder   . Parkinson disease     History reviewed. No pertinent past surgical history.  History reviewed. No pertinent family history.  History  Substance Use Topics  . Smoking status: Not on file  . Smokeless tobacco: Not on file  . Alcohol Use: No      Review of Systems  Psychiatric/Behavioral: Positive for altered mental status.  All other systems reviewed and are negative.    Allergies  Review of patient's allergies indicates no known allergies.  Home Medications   Current Outpatient Rx  Name Route Sig Dispense Refill  . ACETAMINOPHEN 500 MG PO TABS Oral Take 500 mg by mouth at bedtime.    . ASPIRIN EC 81 MG PO TBEC Oral Take 81 mg by mouth daily.      Marland Kitchen CARBIDOPA-LEVODOPA 25-250 MG PO TABS Oral Take 1 tablet by mouth 3 (three) times daily.      Marland Kitchen DOXAZOSIN MESYLATE 8 MG PO TABS Oral Take 8 mg by mouth at bedtime.      . ENALAPRIL MALEATE 10 MG PO TABS Oral Take 10 mg by mouth 2 (two) times daily.      . FUROSEMIDE 40 MG PO TABS Oral Take 40 mg by mouth daily.      . GLYBURIDE 5 MG PO TABS Oral Take 10 mg by mouth 2 (two) times  daily with a meal.      . METFORMIN HCL 500 MG PO TABS Oral Take 1,000 mg by mouth 2 (two) times daily with a meal.      . MIRTAZAPINE 15 MG PO TABS Oral Take 15 mg by mouth at bedtime.    . ADULT MULTIVITAMIN W/MINERALS CH Oral Take 1 tablet by mouth daily.      Marland Kitchen VITAMIN D (ERGOCALCIFEROL) 50000 UNITS PO CAPS Oral Take 50,000 Units by mouth every 14 (fourteen) days.       BP 165/69  Pulse 75  Temp(Src) 98.5 F (36.9 C) (Oral)  Resp 20  Wt 200 lb (90.719 kg)  SpO2 100%  Physical Exam  Nursing note and vitals reviewed. Constitutional: He appears well-developed and well-nourished. No distress.  HENT:  Head: Normocephalic and atraumatic.  Eyes: EOM are normal. Pupils are equal, round, and reactive to light.  Neck: Normal range of motion. Neck supple.  Cardiovascular: Normal rate and regular rhythm.   No murmur heard. Pulmonary/Chest: Breath sounds normal. No respiratory distress. He has no wheezes.  Abdominal: Soft. Bowel sounds are normal. He exhibits no distension. There is no tenderness.  Musculoskeletal: Normal range of motion. He exhibits no edema.  Neurological: He is alert.  Patient is confused, disoriented to person, place, time.  Skin: Skin is warm and dry. He is not diaphoretic.    ED Course  Procedures (including critical care time)  Labs Reviewed  GLUCOSE, CAPILLARY - Abnormal; Notable for the following:    Glucose-Capillary 353 (*)    All other components within normal limits  CBC  DIFFERENTIAL  COMPREHENSIVE METABOLIC PANEL  URINALYSIS, ROUTINE W REFLEX MICROSCOPIC   No results found.   No diagnosis found.   Date: 05/17/2011  Rate: 73  Rhythm: normal sinus rhythm  QRS Axis: normal  Intervals: normal  ST/T Wave abnormalities: normal  Conduction Disutrbances:none  Narrative Interpretation:   Old EKG Reviewed: none available    MDM  The patient was brought for eval after being found wandering naked.  Has had progressive confusion, dementia  and is no longer safe at home.  Will consult medicine for admission.        Geoffery Lyons, MD 05/17/11 2256

## 2011-05-17 NOTE — ED Notes (Signed)
Assisted pt to restroom per pt request - pt escorted via wheelchair, pt unable to urinate. Pt's bed linens changed, bed cleaned as pt urinated in bed. Pt given warm blankets and given call bell. Pt appears pleasantly confused and occasionally makes comments not associated w/ current conversations. Pt able to follow commands otherwise. Pt in no acute distress.

## 2011-05-17 NOTE — ED Notes (Signed)
MD at bedside. 

## 2011-05-18 ENCOUNTER — Encounter (HOSPITAL_COMMUNITY): Payer: Self-pay | Admitting: *Deleted

## 2011-05-18 LAB — COMPREHENSIVE METABOLIC PANEL
ALT: 6 U/L (ref 0–53)
Alkaline Phosphatase: 75 U/L (ref 39–117)
CO2: 27 mEq/L (ref 19–32)
Calcium: 9.2 mg/dL (ref 8.4–10.5)
Chloride: 101 mEq/L (ref 96–112)
GFR calc Af Amer: 55 mL/min — ABNORMAL LOW (ref >90)
GFR calc non Af Amer: 47 mL/min — ABNORMAL LOW (ref >90)
Glucose, Bld: 101 mg/dL — ABNORMAL HIGH (ref 70–99)
Sodium: 140 mEq/L (ref 135–145)
Total Bilirubin: 0.2 mg/dL — ABNORMAL LOW (ref 0.3–1.2)

## 2011-05-18 LAB — CBC
Hemoglobin: 10.9 g/dL — ABNORMAL LOW (ref 13.0–17.0)
MCHC: 32.7 g/dL (ref 30.0–36.0)
Platelets: 257 10*3/uL (ref 150–400)
RDW: 14.4 % (ref 11.5–15.5)

## 2011-05-18 LAB — HEMOGLOBIN A1C
Hgb A1c MFr Bld: 9.1 % — ABNORMAL HIGH (ref <5.7)
Mean Plasma Glucose: 214 mg/dL — ABNORMAL HIGH (ref <117)

## 2011-05-18 LAB — GLUCOSE, CAPILLARY
Glucose-Capillary: 98 mg/dL (ref 70–99)
Glucose-Capillary: 197 mg/dL — ABNORMAL HIGH (ref 70–99)
Glucose-Capillary: 139 mg/dL — ABNORMAL HIGH (ref 70–99)

## 2011-05-18 LAB — RETICULOCYTES
RBC.: 4.04 MIL/uL — ABNORMAL LOW (ref 4.22–5.81)
Retic Count, Absolute: 48.5 10*3/uL (ref 19.0–186.0)
Retic Ct Pct: 1.2 % (ref 0.4–3.1)

## 2011-05-18 LAB — IRON AND TIBC: UIBC: 230 ug/dL (ref 125–400)

## 2011-05-18 LAB — FERRITIN: Ferritin: 40 ng/mL (ref 22–322)

## 2011-05-18 MED ORDER — ADULT MULTIVITAMIN W/MINERALS CH
1.0000 | ORAL_TABLET | Freq: Every day | ORAL | Status: DC
  Administered 2011-05-18 – 2011-05-20 (×3): 1 via ORAL
  Filled 2011-05-18 (×3): qty 1

## 2011-05-18 MED ORDER — HALOPERIDOL LACTATE 5 MG/ML IJ SOLN
1.0000 mg | INTRAMUSCULAR | Status: DC | PRN
  Administered 2011-05-19 (×2): 1 mg via INTRAVENOUS
  Filled 2011-05-18 (×2): qty 1

## 2011-05-18 MED ORDER — MIRTAZAPINE 15 MG PO TABS
15.0000 mg | ORAL_TABLET | Freq: Every day | ORAL | Status: DC
  Administered 2011-05-18 – 2011-05-19 (×3): 15 mg via ORAL
  Filled 2011-05-18 (×4): qty 1

## 2011-05-18 MED ORDER — ENALAPRIL MALEATE 10 MG PO TABS
10.0000 mg | ORAL_TABLET | Freq: Two times a day (BID) | ORAL | Status: DC
  Administered 2011-05-18 – 2011-05-20 (×6): 10 mg via ORAL
  Filled 2011-05-18 (×8): qty 1

## 2011-05-18 MED ORDER — ONDANSETRON HCL 4 MG PO TABS: 4.0000 mg | ORAL_TABLET | Freq: Four times a day (QID) | ORAL | Status: DC | PRN

## 2011-05-18 MED ORDER — ALUM & MAG HYDROXIDE-SIMETH 200-200-20 MG/5ML PO SUSP: 30.0000 mL | Freq: Four times a day (QID) | ORAL | Status: DC | PRN

## 2011-05-18 MED ORDER — INSULIN ASPART 100 UNIT/ML ~~LOC~~ SOLN
0.0000 [IU] | Freq: Three times a day (TID) | SUBCUTANEOUS | Status: DC
  Administered 2011-05-18 (×2): 2 [IU] via SUBCUTANEOUS
  Administered 2011-05-19: 7 [IU] via SUBCUTANEOUS
  Administered 2011-05-20: 2 [IU] via SUBCUTANEOUS
  Filled 2011-05-18: qty 3

## 2011-05-18 MED ORDER — SODIUM CHLORIDE 0.9 % IV SOLN: 250.0000 mL | INTRAVENOUS | Status: DC | PRN

## 2011-05-18 MED ORDER — ONDANSETRON HCL 4 MG/2ML IJ SOLN: 4.0000 mg | Freq: Four times a day (QID) | INTRAMUSCULAR | Status: DC | PRN

## 2011-05-18 MED ORDER — FUROSEMIDE 40 MG PO TABS
40.0000 mg | ORAL_TABLET | Freq: Every morning | ORAL | Status: DC
  Administered 2011-05-18 – 2011-05-20 (×3): 40 mg via ORAL
  Filled 2011-05-18 (×3): qty 1

## 2011-05-18 MED ORDER — ACETAMINOPHEN 650 MG RE SUPP: 650.0000 mg | Freq: Four times a day (QID) | RECTAL | Status: DC | PRN

## 2011-05-18 MED ORDER — ENOXAPARIN SODIUM 40 MG/0.4ML ~~LOC~~ SOLN
40.0000 mg | SUBCUTANEOUS | Status: DC
  Administered 2011-05-18 – 2011-05-20 (×3): 40 mg via SUBCUTANEOUS
  Filled 2011-05-18 (×3): qty 0.4

## 2011-05-18 MED ORDER — CARBIDOPA-LEVODOPA 25-250 MG PO TABS
1.0000 | ORAL_TABLET | Freq: Three times a day (TID) | ORAL | Status: DC
  Administered 2011-05-18 – 2011-05-20 (×8): 1 via ORAL
  Filled 2011-05-18 (×9): qty 1

## 2011-05-18 MED ORDER — HALOPERIDOL LACTATE 5 MG/ML IJ SOLN
INTRAMUSCULAR | Status: AC
  Filled 2011-05-18: qty 1

## 2011-05-18 MED ORDER — GLYBURIDE 5 MG PO TABS
10.0000 mg | ORAL_TABLET | Freq: Two times a day (BID) | ORAL | Status: DC
  Administered 2011-05-18 – 2011-05-20 (×5): 10 mg via ORAL
  Filled 2011-05-18 (×6): qty 2

## 2011-05-18 MED ORDER — METFORMIN HCL 500 MG PO TABS
1000.0000 mg | ORAL_TABLET | Freq: Two times a day (BID) | ORAL | Status: DC
  Administered 2011-05-18 – 2011-05-20 (×5): 1000 mg via ORAL
  Filled 2011-05-18 (×6): qty 2

## 2011-05-18 MED ORDER — DOXAZOSIN MESYLATE 8 MG PO TABS
8.0000 mg | ORAL_TABLET | Freq: Every day | ORAL | Status: DC
  Administered 2011-05-18 – 2011-05-19 (×3): 8 mg via ORAL
  Filled 2011-05-18 (×4): qty 1

## 2011-05-18 MED ORDER — SODIUM CHLORIDE 0.9 % IJ SOLN: 3.0000 mL | INTRAMUSCULAR | Status: DC | PRN

## 2011-05-18 MED ORDER — HALOPERIDOL LACTATE 5 MG/ML IJ SOLN
2.0000 mg | Freq: Once | INTRAMUSCULAR | Status: AC
  Administered 2011-05-18: 2 mg via INTRAVENOUS

## 2011-05-18 MED ORDER — ASPIRIN EC 81 MG PO TBEC
81.0000 mg | DELAYED_RELEASE_TABLET | Freq: Every day | ORAL | Status: DC
  Administered 2011-05-19 – 2011-05-20 (×2): 81 mg via ORAL
  Filled 2011-05-18 (×2): qty 1

## 2011-05-18 MED ORDER — SODIUM CHLORIDE 0.9 % IJ SOLN
3.0000 mL | Freq: Two times a day (BID) | INTRAMUSCULAR | Status: DC
  Administered 2011-05-18: 3 mL via INTRAVENOUS
  Administered 2011-05-18: 10:00:00 via INTRAVENOUS
  Administered 2011-05-19 – 2011-05-20 (×3): 3 mL via INTRAVENOUS

## 2011-05-18 MED ORDER — ACETAMINOPHEN 325 MG PO TABS
650.0000 mg | ORAL_TABLET | Freq: Four times a day (QID) | ORAL | Status: DC | PRN
  Administered 2011-05-18: 650 mg via ORAL
  Filled 2011-05-18: qty 2

## 2011-05-18 NOTE — H&P (Signed)
PCP:   Darnelle Bos, MD, MD   Chief Complaint: Falls and wandering, worsening dementia.  HPI PCP Andrew Dunn  [phone] with h/o dementia  diagn  wife died 1.5 yrs since then has gone downhill  has fallen a couple times, has wandered a couple times.  Family found him wandering outside naked  Needs placement  IN the ED vitals were stable. Labs with renal fxn 19/1.36 stable from 1/7,  hyperglycemic to 237. LFT's normal. WBC 9.7 with normal diff, Hct 33.0 stable. Plts  normal. UA negative. CXR with low lung volumes, vascular crowding, atelectasis. CT  head with age related atrophy, ventriculomegaly and periventricular white matter  disease, nothing acute.  Altered mental status  Hyperglycemia  CKD  Regular, [facility] team  When I saw Andrew Dunn at the bedside no family was present. I did telephone Andrew Dunn who is his daughter. He tells me that she lives next-door to the patient but he lives alone. He has had frequent falls over the past few months. He was brought to the ER in January 2013 for a fall. He was found wandering on more than one occasion and recently on a public Road without clothing. He he appears quite stable. His lab work suggests renal insufficiency but unchanged from prior. His glucose is somewhat elevated but he does have a history of diabetes on medications as below. He is noted to be moderately anemic but his hemoglobin is roughly stable as per today's labs. We will admit him to a regular medical bed and continue his home medications. I did administer a portion of Mini-Mental exam and this suggests a moderate dementia. He was able to follow commands and his affect appears appropriate. The family's wishes is to seek placement as the patient is no longer capable of living independently per the family's opinion. He will need a social service consult. I will also give him therapy for his apparent gait instability.    Review of Systems:  The patient denies anorexia, fever,  weight loss,, vision loss, decreased hearing, hoarseness, chest pain, syncope, dyspnea on exertion. He does complain of peripheral edema. He admits to balance deficits. No hemoptysis, abdominal pain, melena, hematochezia, severe indigestion/heartburn, hematuria, incontinence, genital sores, muscle weakness, suspicious skin lesions, transient blindness, difficulty walking, depression, unusual weight change, abnormal bleeding, enlarged lymph nodes, angioedema, and breast masses.  Past Medical History: Past Medical History  Diagnosis Date  . Hypertension   . Diabetes mellitus   . Anxiety   . Renal disorder   . Parkinson disease    History reviewed. No pertinent past surgical history.  Medications: Prior to Admission medications   Medication Sig Start Date End Date Taking? Authorizing Provider  acetaminophen (TYLENOL) 500 MG tablet Take 500 mg by mouth at bedtime.   Yes Historical Provider, MD  aspirin EC 81 MG tablet Take 81 mg by mouth daily.     Yes Historical Provider, MD  carbidopa-levodopa (SINEMET) 25-250 MG per tablet Take 1 tablet by mouth 3 (three) times daily.     Yes Historical Provider, MD  doxazosin (CARDURA) 8 MG tablet Take 8 mg by mouth at bedtime.     Yes Historical Provider, MD  enalapril (VASOTEC) 10 MG tablet Take 10 mg by mouth 2 (two) times daily.     Yes Historical Provider, MD  furosemide (LASIX) 40 MG tablet Take 40 mg by mouth daily.     Yes Historical Provider, MD  glyBURIDE (DIABETA) 5 MG tablet Take 10 mg by mouth 2 (  two) times daily with a meal.     Yes Historical Provider, MD  metFORMIN (GLUCOPHAGE) 500 MG tablet Take 1,000 mg by mouth 2 (two) times daily with a meal.     Yes Historical Provider, MD  mirtazapine (REMERON) 15 MG tablet Take 15 mg by mouth at bedtime.   Yes Historical Provider, MD  Multiple Vitamin (MULITIVITAMIN WITH MINERALS) TABS Take 1 tablet by mouth daily.     Yes Historical Provider, MD  Vitamin D, Ergocalciferol, (DRISDOL) 50000 UNITS CAPS  Take 50,000 Units by mouth every 14 (fourteen) days.    Yes Historical Provider, MD    Allergies:  No Known Allergies  Social History: Patient tells me he is an ex-smoker. Daughter states he quit approximately 25 years ago. States when he was smoking it was 1-2 packs daily. No significant history of alcohol use. No illicit drugs.  Family History: 2 discussed with daughter. Positive for Parkinson's disease in mother. Diabetes maternal grandmother and mother. Positive for coronary artery disease father who died of MI.  Physical Exam: Filed Vitals:   05/17/11 1718 05/17/11 1938 05/17/11 2245 05/18/11 0046  BP: 165/69 188/73 178/74   Pulse: 75 74 80   Temp: 98.5 F (36.9 C) 98.4 F (36.9 C)    TempSrc: Oral Oral    Resp: 20 16 30 26   Weight: 90.719 kg (200 lb)     SpO2: 100% 97% 96%    Gen. appearance. Well-developed elderly male in no distress. He is alert and cooperative with exam. Does follow commands well. HEENT. Head normocephalic without indication of trauma. Pupils equal. Hearing somewhat diminished conversational tone. Nose patent without discharge. No adenopathy or thyromegaly noted. No bruits or jugular venous distention. Cardiac. Rate and rhythm regular. No S3 or S4. No jugular venous distention. Patient does have 2+ pitting edema of feet and ankles bilaterally. This is roughly baseline for patient per family report. Lungs. Breath sounds are clear and equal bilaterally. No distress or cough. Abdomen. Soft and mildly protuberant. No pain, guarding, rebound tenderness. No bruits or masses noted. Urinary genital. Normal male genitalia. No bladder pain. No CVA tenderness. I note patient has been incontinent of urine. Daughter tells me that he was wearing depends underwear at home. Musculoskeletal. Range of motion is intact in all 4 extremities. Strength gauged 5/5 and equal in all 4 extremities. The patient has some abrasions mid tibial area bilaterally. Also soma scabbed abrasions  and one blister on the toes of the right foot. His pulses are reduced at the pedal sights but intact. Neurologic. Cranial nerves 2-12 grossly intact. No unilateral or focal defects. Patient to oriented to self, partially to location, and denied all to time. Portion of Mini-Mental exam given and I estimate patient with moderate dementia.  Psychiatric. His affect is good. He is alert and cooperative. No clinical indication of depression. Skin. Skin he has some scabbed over abrasions on arms, lower legs, right foot. None of these sites look infected.   Labs on Admission:   [facility] 05/17/11 1955  NA 143  K 3.8  CL 103  CO2 30  GLUCOSE 237*  BUN 19  CREATININE 1.36*  CALCIUM 9.1  MG --  PHOS --    Basename 05/17/11 1955  AST 15  ALT 5  ALKPHOS 77  BILITOT 0.2*  PROT 6.6  ALBUMIN 3.0*   No results found for this basename: LIPASE:2,AMYLASE:2 in the last 72 hours  Basename 05/17/11 1955  WBC 9.7  NEUTROABS 7.0  HGB 10.7*  HCT 33.0*  MCV 83.3  PLT 244   No results found for this basename: CKTOTAL:3,CKMB:3,CKMBINDEX:3,TROPONINI:3 in the last 72 hours No results found for this basename: TSH,T4TOTAL,FREET3,T3FREE,THYROIDAB in the last 72 hours No results found for this basename: VITAMINB12:2,FOLATE:2,FERRITIN:2,TIBC:2,IRON:2,RETICCTPCT:2 in the last 72 hours  Radiological Exams on Admission: Dg Chest 2 View  05/17/2011  *RADIOLOGY REPORT*  Clinical Data: Altered mental status.  CHEST - 2 VIEW  Comparison: 11/18/2010.  Findings: The heart is mildly enlarged but stable.  There is mild tortuosity and calcification of the thoracic aorta.  Low lung volumes with vascular crowding and atelectasis. Stable mild elevation of the right hemidiaphragm.  No infiltrates, edema or effusions.  The bony thorax is intact.  IMPRESSION: Low lung volumes with vascular crowding and atelectasis.  Original Report Authenticated By: P. Loralie Champagne, M.D.   Ct Head Wo Contrast  05/17/2011  *RADIOLOGY  REPORT*  Clinical Data: Altered mental status.  CT HEAD WITHOUT CONTRAST  Technique:  Contiguous axial images were obtained from the base of the skull through the vertex without contrast.  Comparison: MRI brain 02/18/2004.  Findings: There is age related cerebral atrophy and associated ventriculomegaly.  Stable periventricular white matter changes.  No extra-axial fluid collections.  No CT findings for acute hemispheric infarction or intracranial hemorrhage.  No mass lesions.  Benign appearing bilateral basal ganglia calcifications are noted.  The brainstem and cerebellum appear normal.  No acute bony findings.  The paranasal sinuses and mastoid air cells are clear.  IMPRESSION:  1.  Age related cerebral atrophy, ventriculomegaly and periventricular white matter disease. 2.  No acute intracranial findings or mass lesions.  Original Report Authenticated By: P. Loralie Champagne, M.D.    Assessment/Plan Problem #1 failure to thrive. This thought secondary to progressive dementia. Clearly patient's judgment is impaired enough where he is a danger to self living at home independently. I did discuss this in depth with the patient's daughter by phone. They wish to pursue nursing home placement. Will obtain a social work consult to help family with placement. Neurologic exam was unremarkable and a CT scan of his head was clear. There is no indication of an infective process with no fever , no leukocytosis, negative urinalysis, and negative chest x-ray. Problem #2 gait instability and falls. This likely multi-factorial secondary to deconditioning as well as possible associated Parkinson's disease. Patient was seen by Dr. love and started on Sinemet. This apparently for shuffling gait per daughter's report. We'll obtain a physical therapy consult to evaluate and treat. I will continue his outpatient Sinemet as per home dosing. Problem #3 diabetes. I have reviewed current medications glyburide and metformin with pharmacy  because of patient's renal insufficiency. His current renal function is adequate to allow continuation of these medications and I have done so. I will also check his blood sugars 4 times daily with meals and at bedtime. Will cover this as needed with sensitive NovoLog sliding scale. We'll check a hemoglobin A1c. Problem #4 anemia. Patient's hemoglobin down approximately 1 g since January of this year. There is no clinical indication of overt bleeding. I will check stool occult blood and an anemia panel. Repeat CBC in a.m. Problem #5. Chronic renal insufficiency. Patient's renal function appears at baseline compared to the lab results from January. Apparently he has seen a doctor [facility] cardiology in [place]. This for lower extremity edema. He was put on Lasix for this reason and I will continue this at the home dosing. Problem #6. Hypertension. Patient's blood pressure is  somewhat elevated on admission. I will continue his home medications Cardura and Vasotec and these can be adjusted post admission as needed.  Affter discussion with the patient daughter, he is to be a FULL CODE.  We will respect these wishes.  I anticipate his length of stay to be 1-3 days based on current presentation..  Time spent on this patient including examination and decision-making process: 60 minutes.  Rolan Lipa [phone] 05/18/2011, 12:55 AM

## 2011-05-18 NOTE — Progress Notes (Signed)
Patient well known to me.   Recently, had progressive memory issues that had been brought to our attention by dtr. He was seen by NP from [facility] and we discussed treatment of dementia (Alzheimers vs Parkinson's) vs pseudodementia (from depression). He has been quite down since the death of his wife a couple of years ago. I had thought that they were working on ALF placement but I am not sure where that was.   I will be happy to assume care of desired by [facility]. If you would like me to do that, please call me at [phone] (cell).

## 2011-05-18 NOTE — Progress Notes (Signed)
Pt has been an active client of [facility] ([facility]), receiving home visits from the Nurse TEFL teacher. The family desires SNF placement, a paper FL2 has been completed in the community and the pt is currently on the waiting list at [facility]. RN Hospital Liaison will contact the inpatient social worker to discuss the case and facilitate discharge as needed. Brooke Bonito C. Roena Malady, RN, CCM, [facility], # [phone].

## 2011-05-18 NOTE — Evaluation (Signed)
Physical Therapy Evaluation Patient Details Name: Andrew Dunn MRN: [id number] DOB: Jan 23, 1926 Today's Date: 05/18/2011  Problem List: There is no problem list on file for this patient.   Past Medical History:  Past Medical History  Diagnosis Date  . Hypertension   . Diabetes mellitus   . Anxiety   . Renal disorder   . Parkinson disease    Past Surgical History: History reviewed. No pertinent past surgical history.  PT Assessment/Plan/Recommendation PT Assessment Clinical Impression Statement: Patient admitted with FTT presents with decreased independence and safety with mobility as well as decreased safety judgement and will benefit from skilled PT to maximize independence and safety for decreased burden of care at next venue.  Will need SNF level rehab at d/c. PT Recommendation/Assessment: Patient will need skilled PT in the acute care venue PT Problem List: Decreased activity tolerance;Decreased strength;Decreased mobility;Decreased balance;Decreased safety awareness PT Therapy Diagnosis : Abnormality of gait;Generalized weakness PT Plan PT Frequency: Min 3X/week PT Treatment/Interventions: DME instruction;Gait training;Functional mobility training;Therapeutic activities;Therapeutic exercise;Patient/family education PT Recommendation Follow Up Recommendations: Skilled nursing facility Equipment Recommended: Defer to next venue PT Goals  Acute Rehab PT Goals PT Goal Formulation: Patient unable to participate in goal setting Pt will go Supine/Side to Sit: with modified independence PT Goal: Supine/Side to Sit - Progress: Goal set today Pt will go Sit to Stand: with supervision PT Goal: Sit to Stand - Progress: Goal set today Pt will go Stand to Sit: with supervision PT Goal: Stand to Sit - Progress: Goal set today Pt will Stand: with supervision;3 - 5 min;with unilateral upper extremity support (during functional activity) PT Goal: Stand - Progress: Goal set today Pt will  Ambulate: 51 - 150 feet;with rolling walker;with min assist PT Goal: Ambulate - Progress: Goal set today  PT Evaluation Precautions/Restrictions  Precautions Precautions: Fall Prior Functioning  Home Living Lives With: Alone Receives Help From: Other (Comment) (Reports has help each morning for meals and cleaning ) Type of Home: House Home Layout: Two level;Able to live on main level with bedroom/bathroom Home Access: Stairs to enter Entrance Stairs-Rails: None Entrance Stairs-Number of Steps: 2 Bathroom Shower/Tub: Banker: Shower chair with back;Walker - rolling Prior Function Level of Independence: Requires assistive device for independence;Independent with gait;Needs assistance with homemaking;Independent with basic ADLs Comments: Patient unreliable historian Cognition Cognition Arousal/Alertness: Awake/alert Overall Cognitive Status: Impaired Orientation Level: Oriented to person;Disoriented to place;Disoriented to time;Disoriented to situation Safety/Judgement: Decreased safety judgement for tasks assessed Decreased Safety/Judgement: Decreased awareness of need for assistance Safety/Judgement - Other Comments: although he realizes cannot walk far before needing a rest feels he can leave the hospital and drive himself back home Sensation/Coordination   Extremity Assessment RLE Assessment RLE Assessment: Not tested (noted functionally with gait decreased ankle dorsiflexion) LLE Assessment LLE Assessment: Not tested (noted with ambulation decreased ankle dorsiflexion) Mobility (including Balance) Bed Mobility Bed Mobility: Yes Supine to Sit: 4: Min assist Supine to Sit Details (indicate cue type and reason): assist for safety Sitting - Scoot to Edge of Bed: 5: Supervision Transfers Transfers: Yes Sit to Stand: 4: Min assist;From bed Sit to Stand Details (indicate cue type and reason): assist to rise, unable when attempting  unassisted Stand to Sit: 4: Min assist;With upper extremity assist Stand to Sit Details: for safety and cues due to sitting before backed up to chair Ambulation/Gait Ambulation/Gait: Yes Ambulation/Gait Assistance: 4: Min assist Ambulation/Gait Assistance Details (indicate cue type and reason): cues for increased step length and upright posture, unable  to make it back to the room so chair brought to patient Ambulation Distance (Feet): 70 Feet Assistive device: Rolling walker Gait Pattern: Decreased stride length Gait velocity: slow speed  Posture/Postural Control Posture/Postural Control: Postural limitations Postural Limitations: decreased trunk rotation, increased forward weight shift Balance Balance Assessed: Yes Static Standing Balance Static Standing - Balance Support: Right upper extremity supported Static Standing - Level of Assistance: 4: Min assist Static Standing - Comment/# of Minutes: stood to void at toilet with  min assist due to posterior bias. (patient incontinent of urine all the way walking to bathroom) Exercise    End of Session PT - End of Session Equipment Utilized During Treatment: Gait belt Activity Tolerance: Patient limited by fatigue Patient left: with call bell in reach;in chair General Behavior During Session: [facility] for tasks performed Cognition: Impaired  Andrew Dunn,CYNDI 05/18/2011, 1:32 PM

## 2011-05-18 NOTE — Progress Notes (Signed)
Subjective: Patient confused. He stated that he knew me but he could not name me. When I identified myself he stated I was the doctor in [place]. Later [place]  Denies pain, dyspnea.   Objective:  Intake/Output Summary (Last 24 hours) at 05/18/11 2146 Last data filed at 05/18/11 1300  Gross per 24 hour  Intake    343 ml  Output    125 ml  Net    218 ml    Vital Signs: stable except for some systolic elevations Neuro: decrease spontaneous motion; oriented to person  Lab Results:  [facility] 05/18/11 0346 05/17/11 1955  NA 140 143  K 3.4* 3.8  CL 101 103  CO2 27 30  GLUCOSE 101* 237*  BUN 17 19  CREATININE 1.34 1.36*  CALCIUM 9.2 9.1  MG -- --  PHOS -- --    Basename 05/18/11 0346 05/17/11 1955  AST 15 15  ALT 6 5  ALKPHOS 75 77  BILITOT 0.2* 0.2*  PROT 6.4 6.6  ALBUMIN 3.0* 3.0*   No results found for this basename: LIPASE:2,AMYLASE:2 in the last 72 hours  Basename 05/18/11 0346 05/17/11 1955  WBC 10.9* 9.7  NEUTROABS -- 7.0  HGB 10.9* 10.7*  HCT 33.3* 33.0*  MCV 82.4 83.3  PLT 257 244   No results found for this basename: CKTOTAL:3,CKMB:3,CKMBINDEX:3,TROPONINI:3 in the last 72 hours No components found with this basename: POCBNP:3 No results found for this basename: DDIMER:2 in the last 72 hours  Basename 05/18/11 0230  HGBA1C 9.1*   No results found for this basename: CHOL:2,HDL:2,LDLCALC:2,TRIG:2,CHOLHDL:2,LDLDIRECT:2 in the last 72 hours No results found for this basename: TSH,T4TOTAL,FREET3,T3FREE,THYROIDAB in the last 72 hours  Basename 05/18/11 0346  VITAMINB12 923*  FOLATE >20.0  FERRITIN 40  TIBC 267  IRON 37*  RETICCTPCT 1.2    Studies/Results: Dg Chest 2 View  05/17/2011  *RADIOLOGY REPORT*  Clinical Data: Altered mental status.  CHEST - 2 VIEW  Comparison: 11/18/2010.  Findings: The heart is mildly enlarged but stable.  There is mild tortuosity and calcification of the thoracic aorta.   Low lung volumes with vascular crowding and atelectasis. Stable mild elevation of the right hemidiaphragm.  No infiltrates, edema or effusions.  The bony thorax is intact.  IMPRESSION: Low lung volumes with vascular crowding and atelectasis.  Original Report Authenticated By: P. Loralie Champagne, M.D.   Ct Head Wo Contrast  05/17/2011  *RADIOLOGY REPORT*  Clinical Data: Altered mental status.  CT HEAD WITHOUT CONTRAST  Technique:  Contiguous axial images were obtained from the base of the skull through the vertex without contrast.  Comparison: MRI brain 02/18/2004.  Findings: There is age related cerebral atrophy and associated ventriculomegaly.  Stable periventricular white matter changes.  No extra-axial fluid collections.  No CT findings for acute hemispheric infarction or intracranial hemorrhage.  No mass lesions.  Benign appearing bilateral basal ganglia calcifications are noted.  The brainstem and cerebellum appear normal.  No acute bony findings.  The paranasal sinuses and mastoid air cells are clear.  IMPRESSION:  1.  Age related cerebral atrophy, ventriculomegaly and periventricular white matter disease. 2.  No acute intracranial findings or mass lesions.  Original Report Authenticated By: P. Loralie Champagne, M.D.   Medications: Scheduled Meds:   . sodium chloride   Intravenous STAT  . aspirin EC  81 mg Oral Daily  . carbidopa-levodopa  1 tablet Oral TID WC  . doxazosin  8 mg Oral QHS  .  enalapril  10 mg Oral BID  . enoxaparin  40 mg Subcutaneous Q24H  . furosemide  40 mg Oral q morning - 10a  . glyBURIDE  10 mg Oral BID WC  . haloperidol lactate      . haloperidol lactate  2 mg Intravenous Once  . insulin aspart  0-9 Units Subcutaneous TID WC  . metFORMIN  1,000 mg Oral BID WC  . mirtazapine  15 mg Oral QHS  . mulitivitamin with minerals  1 tablet Oral Daily  . sodium chloride  3 mL Intravenous Q12H   Continuous Infusions:  PRN Meds:.sodium chloride, acetaminophen, acetaminophen, alum  & mag hydroxide-simeth, haloperidol lactate, ondansetron (ZOFRAN) IV, ondansetron, sodium chloride  Assessment/Plan: Active Problems:  Confusion - this has been a progressive problem in the last year. 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 He will need placement.    Hypokalemia - recheck in AM. Has not been low before   DM - control is not good, but I don't think he is acutely symptomatic from it. Goal is adequate control to be safe.    LOS: 1 day   Andrew Dunn CHARLES 05/18/2011, 9:46 PM

## 2011-05-19 DIAGNOSIS — D510 Vitamin B12 deficiency anemia due to intrinsic factor deficiency: Secondary | ICD-10-CM | POA: Diagnosis present

## 2011-05-19 DIAGNOSIS — N183 Chronic kidney disease, stage 3 unspecified: Secondary | ICD-10-CM | POA: Diagnosis present

## 2011-05-19 DIAGNOSIS — G20 Parkinson's disease: Secondary | ICD-10-CM | POA: Diagnosis present

## 2011-05-19 DIAGNOSIS — F0390 Unspecified dementia without behavioral disturbance: Secondary | ICD-10-CM | POA: Diagnosis present

## 2011-05-19 DIAGNOSIS — R410 Disorientation, unspecified: Secondary | ICD-10-CM | POA: Diagnosis present

## 2011-05-19 DIAGNOSIS — E876 Hypokalemia: Secondary | ICD-10-CM | POA: Diagnosis not present

## 2011-05-19 DIAGNOSIS — E119 Type 2 diabetes mellitus without complications: Secondary | ICD-10-CM | POA: Diagnosis present

## 2011-05-19 LAB — BASIC METABOLIC PANEL
BUN: 18 mg/dL (ref 6–23)
CO2: 29 mEq/L (ref 19–32)
Calcium: 8.8 mg/dL (ref 8.4–10.5)
Chloride: 102 mEq/L (ref 96–112)
Creatinine, Ser: 1.43 mg/dL — ABNORMAL HIGH (ref 0.50–1.35)
GFR calc Af Amer: 51 mL/min — ABNORMAL LOW (ref >90)
GFR calc non Af Amer: 44 mL/min — ABNORMAL LOW (ref >90)
Glucose, Bld: 69 mg/dL — ABNORMAL LOW (ref 70–99)
Potassium: 3 mEq/L — ABNORMAL LOW (ref 3.5–5.1)
Sodium: 141 mEq/L (ref 135–145)

## 2011-05-19 LAB — GLUCOSE, CAPILLARY
Glucose-Capillary: 108 mg/dL — ABNORMAL HIGH (ref 70–99)
Glucose-Capillary: 113 mg/dL — ABNORMAL HIGH (ref 70–99)

## 2011-05-19 MED ORDER — POTASSIUM CHLORIDE CRYS ER 20 MEQ PO TBCR
20.0000 meq | EXTENDED_RELEASE_TABLET | Freq: Two times a day (BID) | ORAL | Status: DC
  Administered 2011-05-19 – 2011-05-20 (×3): 20 meq via ORAL
  Filled 2011-05-19 (×4): qty 1

## 2011-05-19 MED ORDER — DONEPEZIL HCL 5 MG PO TABS
5.0000 mg | ORAL_TABLET | Freq: Every day | ORAL | Status: DC
  Administered 2011-05-19: 5 mg via ORAL
  Filled 2011-05-19 (×2): qty 1

## 2011-05-19 NOTE — Clinical Documentation Improvement (Signed)
CHANGE MENTAL STATUS DOCUMENTATION CLARIFICATION   THIS DOCUMENT IS NOT A PERMANENT PART OF THE MEDICAL RECORD  TO RESPOND TO THE THIS QUERY, FOLLOW THE INSTRUCTIONS BELOW:  1. If needed, update documentation for the patient's encounter via the notes activity.  2. Access this query again and click edit on the In [employer].  3. After updating, or not, click F2 to complete all highlighted (required) fields concerning your review. Select "additional documentation in the medical record" OR "no additional documentation provided".  4. Click Sign note button.  5. The deficiency will fall out of your In Basket *Please let us know if you are not able to complete this workflow by phone or e-mail (listed below).         05/19/11  Dear Dr. Earl Gala Marton Redwood  In an effort to better capture your patient's severity of illness, reflect appropriate length of stay and utilization of resources, a review of the patient medical record has revealed the following indicators.    Based on your clinical judgment, please clarify and document in a progress note and/or discharge summary the clinical condition associated with the following supporting information:  In responding to this query please exercise your independent judgment.  The fact that a query is asked, does not imply that any particular answer is desired or expected.  Pt admitted with FTT.  According to H/P pt H/P pt with progressive confusion.  Please specify whether or not confusion can qualify as one of the diagnoses listed below and document in the pn and d/c summary.   Possible Clinical Conditions?  _______Encephalopathy (describe type if known)                       Anoxic                       Septic                       Alcoholic                        Hepatic                       Hypertensive                       Metabolic                       _______ Toxic _______Other Condition__________________ _______Cannot Clinically  Determine   Supporting Information:  Risk Factors:  Signs & Symptoms: H/P He was found wandering on more than one occasion and recently on a public Road without clothing. Problem #1 failure to thrive. This thought secondary to progressive dementia. Clearly patient's judgment is impaired enough where he is a danger to self living at home independently. I did discuss this in depth with the patient's daughter by phone  PN 05/20/11 Required two shots of haloperidol due to agitation last night. Security had to be called at one point.   Pn 05/19/11 Delirium - some agitation. Has sitters to minimize use of restraints  Confusion - this has been a progressive problem in the last year. 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 He will need placement  Diagnostics: Component WBC  Latest Ref Rng 4.0 - 10.5 K/uL  05/18/2011 10.9 (H)    Dg Chest 2  View  05/17/2011  *RADIOLOGY REPORT*  Clinical Data: Altered mental status.  CHEST - 2 VIEW  Comparison: 11/18/2010.  Findings: The heart is mildly enlarged but stable.  There is mild tortuosity and calcification of the thoracic aorta.  Low lung volumes with vascular crowding and atelectasis. Stable mild elevation of the right hemidiaphragm.  No infiltrates, edema or effusions.  The bony thorax is intact.  IMPRESSION: Low lung volumes with vascular crowding and atelectasis.  Original Report Authenticated By: P. Loralie Champagne, M.D.    Treatment: haloperidol   Reviewed:  no additional documentation provided 2/13/2013ljh  Thank You,  Enis Slipper RN, BSN, CCDS Clinical Documentation Specialist [facility] Dept Pager: [phone] / E-mail: [email or web address]  Health Information Management [place]

## 2011-05-19 NOTE — Progress Notes (Signed)
Subjective: Patient asleep. When awakened, denies pain. Required two shots of haloperidol due to agitation last night. Security had to be called at one point.   Objective:  Intake/Output Summary (Last 24 hours) at 05/19/11 0644 Last data filed at 05/18/11 2217  Gross per 24 hour  Intake    463 ml  Output    125 ml  Net    338 ml    Vital Signs: BP varying, still high at times.   Lab Results:  [facility] 05/19/11 0350 05/18/11 0346  NA 141 140  K 3.0* 3.4*  CL 102 101  CO2 29 27  GLUCOSE 69* 101*  BUN 18 17  CREATININE 1.43* 1.34  CALCIUM 8.8 9.2  MG -- --  PHOS -- --    Basename 05/18/11 0346 05/17/11 1955  AST 15 15  ALT 6 5  ALKPHOS 75 77  BILITOT 0.2* 0.2*  PROT 6.4 6.6  ALBUMIN 3.0* 3.0*   No results found for this basename: LIPASE:2,AMYLASE:2 in the last 72 hours  Basename 05/18/11 0346 05/17/11 1955  WBC 10.9* 9.7  NEUTROABS -- 7.0  HGB 10.9* 10.7*  HCT 33.3* 33.0*  MCV 82.4 83.3  PLT 257 244   No results found for this basename: CKTOTAL:3,CKMB:3,CKMBINDEX:3,TROPONINI:3 in the last 72 hours No components found with this basename: POCBNP:3 No results found for this basename: DDIMER:2 in the last 72 hours  Basename 05/18/11 0230  HGBA1C 9.1*   No results found for this basename: CHOL:2,HDL:2,LDLCALC:2,TRIG:2,CHOLHDL:2,LDLDIRECT:2 in the last 72 hours No results found for this basename: TSH,T4TOTAL,FREET3,T3FREE,THYROIDAB in the last 72 hours  Basename 05/18/11 0346  VITAMINB12 923*  FOLATE >20.0  FERRITIN 40  TIBC 267  IRON 37*  RETICCTPCT 1.2    Studies/Results: Dg Chest 2 View  05/17/2011  *RADIOLOGY REPORT*  Clinical Data: Altered mental status.  CHEST - 2 VIEW  Comparison: 11/18/2010.  Findings: The heart is mildly enlarged but stable.  There is mild tortuosity and calcification of the thoracic aorta.  Low lung volumes with vascular crowding and atelectasis. Stable mild elevation of the right hemidiaphragm.  No infiltrates, edema or  effusions.  The bony thorax is intact.  IMPRESSION: Low lung volumes with vascular crowding and atelectasis.  Original Report Authenticated By: P. Loralie Champagne, M.D.   Ct Head Wo Contrast  05/17/2011  *RADIOLOGY REPORT*  Clinical Data: Altered mental status.  CT HEAD WITHOUT CONTRAST  Technique:  Contiguous axial images were obtained from the base of the skull through the vertex without contrast.  Comparison: MRI brain 02/18/2004.  Findings: There is age related cerebral atrophy and associated ventriculomegaly.  Stable periventricular white matter changes.  No extra-axial fluid collections.  No CT findings for acute hemispheric infarction or intracranial hemorrhage.  No mass lesions.  Benign appearing bilateral basal ganglia calcifications are noted.  The brainstem and cerebellum appear normal.  No acute bony findings.  The paranasal sinuses and mastoid air cells are clear.  IMPRESSION:  1.  Age related cerebral atrophy, ventriculomegaly and periventricular white matter disease. 2.  No acute intracranial findings or mass lesions.  Original Report Authenticated By: P. Loralie Champagne, M.D.   Medications: Scheduled Meds:   . sodium chloride   Intravenous STAT  . aspirin EC  81 mg Oral Daily  . carbidopa-levodopa  1 tablet Oral TID WC  . doxazosin  8 mg Oral QHS  . enalapril  10 mg Oral BID  . enoxaparin  40 mg Subcutaneous Q24H  . furosemide  40 mg Oral q  morning - 10a  . glyBURIDE  10 mg Oral BID WC  . haloperidol lactate      . haloperidol lactate  2 mg Intravenous Once  . insulin aspart  0-9 Units Subcutaneous TID WC  . metFORMIN  1,000 mg Oral BID WC  . mirtazapine  15 mg Oral QHS  . mulitivitamin with minerals  1 tablet Oral Daily  . sodium chloride  3 mL Intravenous Q12H   Continuous Infusions:  PRN Meds:.sodium chloride, acetaminophen, acetaminophen, alum & mag hydroxide-simeth, haloperidol lactate, ondansetron (ZOFRAN) IV, ondansetron, sodium chloride  Assessment/Plan: Principal  Problem:  *Dementia - I am going to go ahead and start Aricept as I think most of his dementia is not pseudodementia but is likely to be SDAT or PD. For SNF due to safety issues. FL22 signed in community. Not sure if another one is needed.  Active Problems:  Delirium - some agitation. Has sitters to minimize use of restraints  Diabetes mellitus - CBGs look good here although A1c was high. At this point, no additional Rx for DM planned  Chronic kidney disease (CKD), stage III (moderate)  Hypokalemia - will replete  Parkinson's disease - on meds.    LOS: 2 days   [facility] 05/19/2011, 6:44 AM

## 2011-05-19 NOTE — Progress Notes (Addendum)
CSW visited with the pt who was resting peacefully, with a sitter at bedside. CSW called the pts daughter and completed a psychosocial assessment (pls see shadow chart). She reports the pt seemed to really decline in the last few months, asking where his wife was that passed away in 09/20/2009 and he would forget his daughters name. Pt lives next door to his daughter and he has a Comptroller that would come to his home from 8-12.  The pts daughter expressed that her and he brother have been concerned about their fathers health and after the neighbor found his in the drive way unclothed they knew he needed medical attention.  Per the pts family and [facility] Care Management pt was being followed in the community and he was on the waiting list for [facility]. CSW informed the pts daughter that a FL-2 would be completed and sent to the facility, CSW also e-mailed the pts daughter a list of SNF, is the event that [facility] does not have a bed available, or does not make a bed offer. CSW explained that most facilities would want the pts sitter to be discontinued for 24 hours before they admit to the facility. CSW will continue to follow the pt and offer support.  Golden Pop 05/19/2011 10:24 AM [phone]  Pt daughter at bedside. CSW notified her that [facility] has made a bed offer for a semi-private room on their Dementia unit. CSW spoke with Ruby at the facility to see if the family needed to pay to hold the bed and she reports that at this time she does not feel the bed will be gone when the pt is discharged. CSW will continue to follow.  05/19/2011 3:06 PM

## 2011-05-19 NOTE — H&P (Signed)
85yoM patient of Dr. Earl Gala found wandering the streets, have admitted the patient for possible placement and spoke with Dr. Earl Gala, who will assume care of the patient.

## 2011-05-20 LAB — GLUCOSE, CAPILLARY: Glucose-Capillary: 167 mg/dL — ABNORMAL HIGH (ref 70–99)

## 2011-05-20 LAB — TSH: TSH: 0.676 u[IU]/mL (ref 0.350–4.500)

## 2011-05-20 LAB — T4, FREE: Free T4: 0.96 ng/dL (ref 0.80–1.80)

## 2011-05-20 LAB — BASIC METABOLIC PANEL
BUN: 18 mg/dL (ref 6–23)
CO2: 30 mEq/L (ref 19–32)
Calcium: 8.9 mg/dL (ref 8.4–10.5)
Chloride: 103 mEq/L (ref 96–112)
Creatinine, Ser: 1.51 mg/dL — ABNORMAL HIGH (ref 0.50–1.35)
GFR calc Af Amer: 47 mL/min — ABNORMAL LOW (ref >90)
GFR calc non Af Amer: 41 mL/min — ABNORMAL LOW (ref >90)
Glucose, Bld: 74 mg/dL (ref 70–99)
Potassium: 3.7 mEq/L (ref 3.5–5.1)
Sodium: 140 mEq/L (ref 135–145)

## 2011-05-20 MED ORDER — POTASSIUM CHLORIDE CRYS ER 20 MEQ PO TBCR: 20.0000 meq | EXTENDED_RELEASE_TABLET | Freq: Every day | ORAL | Status: DC

## 2011-05-20 MED ORDER — DONEPEZIL HCL 5 MG PO TABS: 5.0000 mg | ORAL_TABLET | Freq: Every day | ORAL | Status: DC

## 2011-05-20 NOTE — Progress Notes (Addendum)
CSW notified by care coordinator Bonita Quin that pt is ready for discharge.  The care coordinator faxed the FL-2 to Dr. Newell Coral office to be signed. CSW has sent clinicals to [facility], she has confirmed they were received. CSW has spoken with the pts daughter and she plans to call Ruby at the facility to schedule a time to complete admission paperwork. CSW will continue to facilitate the pts discharge.  Golden Pop 05/20/2011 11:34 AM [phone]  CSW has spoken with Ruby at [facility] and she reports that is that pt can transport to the facility after 1pm, which is the time that has been set for the pts daughter Jasmine December to meet with Ruby to complete paperwork. CSW has compiled documents to transport with the pt.CSW has spoken with the pts RN Victorino Dike and care coordinator Bonita Quin and they both are aware that  [facility] transportation has been prearranged for 1pm. CSW has attempted to call the pts daughter to let her know transportation has been arranged. CSW signing off.  05/20/2011 12:19 PM

## 2011-05-20 NOTE — Progress Notes (Signed)
Physical Therapy Treatment Patient Details Name: Andrew Dunn MRN: [id number] DOB: 12/18/1925 Today's Date: 05/20/2011  9:15 - 9:50 1 gt  1 ta  PT Assessment/Plan  PT - Assessment/Plan Comments on Treatment Session: Pt still present with some confusion to time/place.  Assisted pt OOB to bathroom then amb in hallway. PT Plan: Discharge plan remains appropriate Follow Up Recommendations: Skilled nursing facility Equipment Recommended: Defer to next venue PT Goals  Acute Rehab PT Goals PT Goal Formulation: With patient Pt will go Supine/Side to Sit: with modified independence PT Goal: Supine/Side to Sit - Progress: Progressing toward goal Pt will go Sit to Stand: with supervision PT Goal: Sit to Stand - Progress: Progressing toward goal Pt will go Stand to Sit: with supervision PT Goal: Stand to Sit - Progress: Progressing toward goal Pt will Stand: with supervision;3 - 5 min;with unilateral upper extremity support PT Goal: Stand - Progress: Progressing toward goal Pt will Ambulate: 51 - 150 feet;with rolling walker;with min assist PT Goal: Ambulate - Progress: Progressing toward goal  PT Treatment Precautions/Restrictions  Precautions Precautions: Fall Restrictions Weight Bearing Restrictions: No Mobility (including Balance) Bed Mobility Bed Mobility: Yes Supine to Sit:  (MinGuard Assist) Supine to Sit Details (indicate cue type and reason): increased time and repeat VC's to dtay on task Transfers Transfers: Yes Sit to Stand:  (MinGuard Assist) Sit to Stand Details (indicate cue type and reason): VC's for safety Stand to Sit:  (MinGuard Assist) Stand to Sit Details: VC's on safety Ambulation/Gait Ambulation/Gait: Yes Ambulation/Gait Assistance: 4: Min assist Ambulation/Gait Assistance Details (indicate cue type and reason): 75% VC's on safety using the RW, proper walker to self distance, increase posture and backward gait safety. Ambulation Distance (Feet): 55  Feet Assistive device: Rolling walker Gait Pattern: Step-through pattern;Trunk flexed Gait velocity: Pt demon limited activity tolerance and fatigues quickly Stairs: No Wheelchair Mobility Wheelchair Mobility: No    Exercise    End of Session PT - End of Session Equipment Utilized During Treatment: Gait belt Activity Tolerance: Patient limited by fatigue Patient left: in chair;with call bell in reach;Other (comment) (left at nurses station ) General Behavior During Session: [facility] for tasks performed Cognition: [facility] for tasks performed  Felecia Shelling  PTA [facility]  Acute  Rehab Pager     [phone]

## 2011-05-20 NOTE — Discharge Summary (Signed)
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 He has had progressive memory disturbance with confusion in the last six months. This is on top of a past history of Parkinsons disease (neurologist is Dr. Sandria Manly). We had recently had [facility] Care Management staff trying to get him placed into a facility due to problems with confusional behavior at home. Things worsened and he became a danger to himself in terms of walking outside in inclement weather. He was admitted.   No infection was noted as a cause of worsening mental status. CT scan of head was negative. In hospital, he was generally oriented to place but did have some intermittent problems with agitation that responded to reassurance and haloperidol. He was seen by OT and PT and SNF rehab was recommended to try to improve functional status. However, it is clear from his pre-hospital status that he will need long term care in a memory facility (at SNF or ALF level). I discussed these issues with the patient's dtr and she concurred.   In terms of his dementia, diff dx is pseudodementia of depression vs dementia from Alzheimers or Parkinsons. He had been started on Remeron about 4 weeks PTA and no improvement was seen. At this admission Aricpet 5 mg daily was started. It is  recommended that this be increased to 10 mg in one month. Subsequently, addition of Namenda could be considered.   Consults: PT, OT, SW  Disposition: SNF Rehab  @DISCHARGEORDER @ Medication List  As of 05/20/2011  8:37 AM   TAKE these medications         acetaminophen 500 MG tablet   Commonly known as: TYLENOL   Take 500 mg by mouth at bedtime.      aspirin EC 81 MG tablet   Take 81 mg by mouth daily.      carbidopa-levodopa 25-250 MG per tablet   Commonly known as: SINEMET   Take 1 tablet by mouth 3 (three) times daily.      donepezil 5 MG tablet   Commonly known as: ARICEPT   Take 1 tablet (5 mg total) by mouth at bedtime.      doxazosin 8 MG tablet   Commonly known as: CARDURA   Take 8 mg by mouth at bedtime.      enalapril 10 MG tablet   Commonly known as: VASOTEC   Take 10 mg by mouth 2 (two) times daily.      furosemide 40 MG tablet   Commonly known as: LASIX   Take 40 mg by mouth daily.      glyBURIDE 5 MG tablet   Commonly known as: DIABETA   Take 10 mg by mouth 2 (two) times daily with a meal.      metFORMIN 500 MG tablet   Commonly known as: GLUCOPHAGE   Take 1,000 mg by mouth 2 (two)  times daily with a meal.      mirtazapine 15 MG tablet   Commonly known as: REMERON   Take 15 mg by mouth at bedtime.      mulitivitamin with minerals Tabs   Take 1 tablet by mouth daily.      potassium chloride SA 20 MEQ tablet   Commonly known as: K-DUR,KLOR-CON   Take 1 tablet (20 mEq total) by mouth daily.      Vitamin D (Ergocalciferol) 50000 UNITS Caps   Commonly known as: DRISDOL   Take 50,000 Units by mouth every 14 (fourteen) days.           Follow-up Information    Call Darnelle Bos, MD.         Things to follow up in the outpatient setting: increase Aricept in one month as indicated in summary above  Time coordinating discharge: including medication reconciliation, transmission of prescriptions to pharmacy, preparation of  discharge papers, and discussion with patient and family    Signed: Darnelle Bos 05/20/2011, 8:37 AM

## 2013-05-06 ENCOUNTER — Encounter: Payer: Self-pay | Admitting: *Deleted

## 2013-05-06 DIAGNOSIS — M8580 Other specified disorders of bone density and structure, unspecified site: Secondary | ICD-10-CM | POA: Insufficient documentation

## 2013-05-06 DIAGNOSIS — M5416 Radiculopathy, lumbar region: Secondary | ICD-10-CM | POA: Insufficient documentation

## 2014-08-22 ENCOUNTER — Inpatient Hospital Stay (HOSPITAL_COMMUNITY)
Admission: EM | Admit: 2014-08-22 | Discharge: 2014-08-28 | DRG: 871 | Disposition: A | Discharge status: Skilled Nursing Facility | Payer: Medicare Other | Attending: Internal Medicine | Admitting: Internal Medicine

## 2014-08-22 ENCOUNTER — Encounter (HOSPITAL_COMMUNITY): Payer: Self-pay | Admitting: *Deleted

## 2014-08-22 ENCOUNTER — Emergency Department (HOSPITAL_COMMUNITY): Payer: Medicare Other

## 2014-08-22 DIAGNOSIS — I509 Heart failure, unspecified: Secondary | ICD-10-CM

## 2014-08-22 DIAGNOSIS — N189 Chronic kidney disease, unspecified: Secondary | ICD-10-CM | POA: Diagnosis present

## 2014-08-22 DIAGNOSIS — E876 Hypokalemia: Secondary | ICD-10-CM | POA: Diagnosis present

## 2014-08-22 DIAGNOSIS — N179 Acute kidney failure, unspecified: Secondary | ICD-10-CM | POA: Diagnosis not present

## 2014-08-22 DIAGNOSIS — D72829 Elevated white blood cell count, unspecified: Secondary | ICD-10-CM | POA: Diagnosis present

## 2014-08-22 DIAGNOSIS — Z7982 Long term (current) use of aspirin: Secondary | ICD-10-CM

## 2014-08-22 DIAGNOSIS — E1122 Type 2 diabetes mellitus with diabetic chronic kidney disease: Secondary | ICD-10-CM | POA: Diagnosis present

## 2014-08-22 DIAGNOSIS — J690 Pneumonitis due to inhalation of food and vomit: Secondary | ICD-10-CM | POA: Diagnosis not present

## 2014-08-22 DIAGNOSIS — J9811 Atelectasis: Secondary | ICD-10-CM | POA: Diagnosis present

## 2014-08-22 DIAGNOSIS — E119 Type 2 diabetes mellitus without complications: Secondary | ICD-10-CM

## 2014-08-22 DIAGNOSIS — I5021 Acute systolic (congestive) heart failure: Secondary | ICD-10-CM | POA: Diagnosis present

## 2014-08-22 DIAGNOSIS — J9601 Acute respiratory failure with hypoxia: Secondary | ICD-10-CM

## 2014-08-22 DIAGNOSIS — T502X5A Adverse effect of carbonic-anhydrase inhibitors, benzothiadiazides and other diuretics, initial encounter: Secondary | ICD-10-CM | POA: Diagnosis present

## 2014-08-22 DIAGNOSIS — Y95 Nosocomial condition: Secondary | ICD-10-CM | POA: Diagnosis present

## 2014-08-22 DIAGNOSIS — F0281 Dementia in other diseases classified elsewhere with behavioral disturbance: Secondary | ICD-10-CM | POA: Diagnosis present

## 2014-08-22 DIAGNOSIS — I214 Non-ST elevation (NSTEMI) myocardial infarction: Secondary | ICD-10-CM | POA: Diagnosis not present

## 2014-08-22 DIAGNOSIS — N184 Chronic kidney disease, stage 4 (severe): Secondary | ICD-10-CM | POA: Diagnosis present

## 2014-08-22 DIAGNOSIS — R339 Retention of urine, unspecified: Secondary | ICD-10-CM | POA: Diagnosis present

## 2014-08-22 DIAGNOSIS — I130 Hypertensive heart and chronic kidney disease with heart failure and stage 1 through stage 4 chronic kidney disease, or unspecified chronic kidney disease: Secondary | ICD-10-CM | POA: Diagnosis present

## 2014-08-22 DIAGNOSIS — R0602 Shortness of breath: Secondary | ICD-10-CM | POA: Diagnosis not present

## 2014-08-22 DIAGNOSIS — Z794 Long term (current) use of insulin: Secondary | ICD-10-CM | POA: Diagnosis not present

## 2014-08-22 DIAGNOSIS — I4891 Unspecified atrial fibrillation: Secondary | ICD-10-CM | POA: Diagnosis present

## 2014-08-22 DIAGNOSIS — I480 Paroxysmal atrial fibrillation: Secondary | ICD-10-CM | POA: Diagnosis not present

## 2014-08-22 DIAGNOSIS — R778 Other specified abnormalities of plasma proteins: Secondary | ICD-10-CM | POA: Diagnosis present

## 2014-08-22 DIAGNOSIS — M8580 Other specified disorders of bone density and structure, unspecified site: Secondary | ICD-10-CM | POA: Diagnosis present

## 2014-08-22 DIAGNOSIS — I452 Bifascicular block: Secondary | ICD-10-CM | POA: Diagnosis present

## 2014-08-22 DIAGNOSIS — Z66 Do not resuscitate: Secondary | ICD-10-CM | POA: Diagnosis present

## 2014-08-22 DIAGNOSIS — R7989 Other specified abnormal findings of blood chemistry: Secondary | ICD-10-CM | POA: Diagnosis present

## 2014-08-22 DIAGNOSIS — R0603 Acute respiratory distress: Secondary | ICD-10-CM

## 2014-08-22 DIAGNOSIS — R9431 Abnormal electrocardiogram [ECG] [EKG]: Secondary | ICD-10-CM

## 2014-08-22 DIAGNOSIS — M5416 Radiculopathy, lumbar region: Secondary | ICD-10-CM | POA: Diagnosis present

## 2014-08-22 DIAGNOSIS — F419 Anxiety disorder, unspecified: Secondary | ICD-10-CM | POA: Diagnosis present

## 2014-08-22 DIAGNOSIS — A419 Sepsis, unspecified organism: Secondary | ICD-10-CM | POA: Diagnosis present

## 2014-08-22 DIAGNOSIS — E875 Hyperkalemia: Secondary | ICD-10-CM

## 2014-08-22 DIAGNOSIS — N390 Urinary tract infection, site not specified: Secondary | ICD-10-CM | POA: Diagnosis present

## 2014-08-22 DIAGNOSIS — I272 Other secondary pulmonary hypertension: Secondary | ICD-10-CM | POA: Diagnosis present

## 2014-08-22 DIAGNOSIS — G20 Parkinson's disease: Secondary | ICD-10-CM | POA: Diagnosis not present

## 2014-08-22 DIAGNOSIS — I220 Subsequent ST elevation (STEMI) myocardial infarction of anterior wall: Secondary | ICD-10-CM | POA: Diagnosis not present

## 2014-08-22 DIAGNOSIS — E1165 Type 2 diabetes mellitus with hyperglycemia: Secondary | ICD-10-CM | POA: Diagnosis present

## 2014-08-22 DIAGNOSIS — F0390 Unspecified dementia without behavioral disturbance: Secondary | ICD-10-CM | POA: Diagnosis present

## 2014-08-22 DIAGNOSIS — R1313 Dysphagia, pharyngeal phase: Secondary | ICD-10-CM | POA: Diagnosis present

## 2014-08-22 DIAGNOSIS — IMO0002 Reserved for concepts with insufficient information to code with codable children: Secondary | ICD-10-CM | POA: Diagnosis present

## 2014-08-22 DIAGNOSIS — J189 Pneumonia, unspecified organism: Secondary | ICD-10-CM | POA: Diagnosis not present

## 2014-08-22 DIAGNOSIS — N183 Chronic kidney disease, stage 3 unspecified: Secondary | ICD-10-CM | POA: Diagnosis present

## 2014-08-22 DIAGNOSIS — D510 Vitamin B12 deficiency anemia due to intrinsic factor deficiency: Secondary | ICD-10-CM | POA: Diagnosis present

## 2014-08-22 DIAGNOSIS — G20A1 Parkinson's disease without dyskinesia, without mention of fluctuations: Secondary | ICD-10-CM | POA: Diagnosis present

## 2014-08-22 DIAGNOSIS — I219 Acute myocardial infarction, unspecified: Secondary | ICD-10-CM | POA: Diagnosis present

## 2014-08-22 DIAGNOSIS — Z87891 Personal history of nicotine dependence: Secondary | ICD-10-CM | POA: Diagnosis not present

## 2014-08-22 HISTORY — DX: Heart failure, unspecified: I50.9

## 2014-08-22 LAB — URINALYSIS, ROUTINE W REFLEX MICROSCOPIC
BILIRUBIN URINE: NEGATIVE
GLUCOSE, UA: NEGATIVE mg/dL
KETONES UR: 15 mg/dL — AB
NITRITE: NEGATIVE
PH: 7.5 (ref 5.0–8.0)
Protein, ur: >300 mg/dL — AB
SPECIFIC GRAVITY, URINE: 1.016 (ref 1.005–1.030)
Urobilinogen, UA: 1 mg/dL (ref 0.0–1.0)

## 2014-08-22 LAB — COMPREHENSIVE METABOLIC PANEL
ALBUMIN: 2.6 g/dL — AB (ref 3.5–5.0)
ALK PHOS: 65 U/L (ref 38–126)
ALT: 14 U/L — ABNORMAL LOW (ref 17–63)
ANION GAP: 13 (ref 5–15)
AST: 28 U/L (ref 15–41)
BUN: 41 mg/dL — AB (ref 6–20)
CALCIUM: 7.8 mg/dL — AB (ref 8.9–10.3)
CHLORIDE: 99 mmol/L — AB (ref 101–111)
CO2: 27 mmol/L (ref 22–32)
CREATININE: 2.14 mg/dL — AB (ref 0.61–1.24)
GFR calc Af Amer: 30 mL/min — ABNORMAL LOW (ref >60)
GFR calc non Af Amer: 26 mL/min — ABNORMAL LOW (ref >60)
Glucose, Bld: 295 mg/dL — ABNORMAL HIGH (ref 65–99)
Potassium: 3.5 mmol/L (ref 3.5–5.1)
Sodium: 139 mmol/L (ref 135–145)
TOTAL PROTEIN: 6.1 g/dL — AB (ref 6.5–8.1)
Total Bilirubin: 1 mg/dL (ref 0.3–1.2)

## 2014-08-22 LAB — I-STAT TROPONIN, ED: TROPONIN I, POC: 1.11 ng/mL — AB (ref 0.00–0.08)

## 2014-08-22 LAB — CBC
HCT: 35.2 % — ABNORMAL LOW (ref 39.0–52.0)
Hemoglobin: 11.3 g/dL — ABNORMAL LOW (ref 13.0–17.0)
MCH: 27.6 pg (ref 26.0–34.0)
MCHC: 32.1 g/dL (ref 30.0–36.0)
MCV: 85.9 fL (ref 78.0–100.0)
PLATELETS: 200 10*3/uL (ref 150–400)
RBC: 4.1 MIL/uL — ABNORMAL LOW (ref 4.22–5.81)
RDW: 13.8 % (ref 11.5–15.5)
WBC: 16.2 10*3/uL — ABNORMAL HIGH (ref 4.0–10.5)

## 2014-08-22 LAB — I-STAT ARTERIAL BLOOD GAS, ED
Acid-base deficit: 1 mmol/L (ref 0.0–2.0)
BICARBONATE: 25.3 meq/L — AB (ref 20.0–24.0)
O2 Saturation: 99 %
PH ART: 7.342 — AB (ref 7.350–7.450)
PO2 ART: 150 mmHg — AB (ref 80.0–100.0)
TCO2: 27 mmol/L (ref 0–100)
pCO2 arterial: 46.7 mmHg — ABNORMAL HIGH (ref 35.0–45.0)

## 2014-08-22 LAB — URINE MICROSCOPIC-ADD ON

## 2014-08-22 LAB — GLUCOSE, CAPILLARY
GLUCOSE-CAPILLARY: 360 mg/dL — AB (ref 65–99)
Glucose-Capillary: 265 mg/dL — ABNORMAL HIGH (ref 65–99)

## 2014-08-22 LAB — PROTIME-INR
INR: 1.13 (ref 0.00–1.49)
PROTHROMBIN TIME: 14.7 s (ref 11.6–15.2)

## 2014-08-22 LAB — BASIC METABOLIC PANEL
Anion gap: 11 (ref 5–15)
BUN: 40 mg/dL — AB (ref 6–20)
CALCIUM: 7.9 mg/dL — AB (ref 8.9–10.3)
CO2: 26 mmol/L (ref 22–32)
CREATININE: 1.96 mg/dL — AB (ref 0.61–1.24)
Chloride: 101 mmol/L (ref 101–111)
GFR, EST AFRICAN AMERICAN: 34 mL/min — AB (ref >60)
GFR, EST NON AFRICAN AMERICAN: 29 mL/min — AB (ref >60)
Glucose, Bld: 305 mg/dL — ABNORMAL HIGH (ref 65–99)
Potassium: 3 mmol/L — ABNORMAL LOW (ref 3.5–5.1)
Sodium: 138 mmol/L (ref 135–145)

## 2014-08-22 LAB — TSH: TSH: 0.161 u[IU]/mL — ABNORMAL LOW (ref 0.350–4.500)

## 2014-08-22 LAB — TROPONIN I: TROPONIN I: 2.74 ng/mL — AB (ref <0.031)

## 2014-08-22 LAB — BRAIN NATRIURETIC PEPTIDE: B Natriuretic Peptide: 772.4 pg/mL — ABNORMAL HIGH (ref 0.0–100.0)

## 2014-08-22 LAB — I-STAT CG4 LACTIC ACID, ED: Lactic Acid, Venous: 1.47 mmol/L (ref 0.5–2.0)

## 2014-08-22 LAB — LACTIC ACID, PLASMA: LACTIC ACID, VENOUS: 1.8 mmol/L (ref 0.5–2.0)

## 2014-08-22 LAB — MRSA PCR SCREENING: MRSA BY PCR: NEGATIVE

## 2014-08-22 LAB — PROCALCITONIN: PROCALCITONIN: 9.72 ng/mL

## 2014-08-22 LAB — MAGNESIUM: MAGNESIUM: 1.7 mg/dL (ref 1.7–2.4)

## 2014-08-22 MED ORDER — MIRTAZAPINE 7.5 MG PO TABS
15.0000 mg | ORAL_TABLET | Freq: Every day | ORAL | Status: DC
  Administered 2014-08-22 – 2014-08-27 (×6): 15 mg via ORAL
  Filled 2014-08-22 (×2): qty 1
  Filled 2014-08-22: qty 2
  Filled 2014-08-22: qty 1
  Filled 2014-08-22 (×3): qty 2

## 2014-08-22 MED ORDER — HEPARIN BOLUS VIA INFUSION
4000.0000 [IU] | Freq: Once | INTRAVENOUS | Status: AC
  Administered 2014-08-22: 4000 [IU] via INTRAVENOUS
  Filled 2014-08-22: qty 4000

## 2014-08-22 MED ORDER — POTASSIUM CHLORIDE CRYS ER 20 MEQ PO TBCR
40.0000 meq | EXTENDED_RELEASE_TABLET | ORAL | Status: AC
  Administered 2014-08-22 (×2): 40 meq via ORAL
  Filled 2014-08-22 (×2): qty 2

## 2014-08-22 MED ORDER — INSULIN ASPART 100 UNIT/ML ~~LOC~~ SOLN
0.0000 [IU] | Freq: Three times a day (TID) | SUBCUTANEOUS | Status: DC
  Administered 2014-08-23: 3 [IU] via SUBCUTANEOUS

## 2014-08-22 MED ORDER — IPRATROPIUM BROMIDE 0.02 % IN SOLN
0.5000 mg | Freq: Four times a day (QID) | RESPIRATORY_TRACT | Status: DC
  Administered 2014-08-22: 0.5 mg via RESPIRATORY_TRACT
  Filled 2014-08-22: qty 2.5

## 2014-08-22 MED ORDER — NITROGLYCERIN 0.4 MG SL SUBL: 0.4000 mg | SUBLINGUAL_TABLET | SUBLINGUAL | Status: DC | PRN

## 2014-08-22 MED ORDER — DEXTROSE 5 % IV SOLN
1.0000 g | Freq: Three times a day (TID) | INTRAVENOUS | Status: DC
  Filled 2014-08-22 (×2): qty 1

## 2014-08-22 MED ORDER — CEFEPIME HCL 1 G IJ SOLR
1.0000 g | INTRAMUSCULAR | Status: DC
  Administered 2014-08-22 – 2014-08-24 (×3): 1 g via INTRAVENOUS
  Filled 2014-08-22 (×5): qty 1

## 2014-08-22 MED ORDER — POTASSIUM CHLORIDE CRYS ER 20 MEQ PO TBCR
40.0000 meq | EXTENDED_RELEASE_TABLET | Freq: Every day | ORAL | Status: DC
  Administered 2014-08-23 – 2014-08-26 (×3): 40 meq via ORAL
  Filled 2014-08-22 (×4): qty 2

## 2014-08-22 MED ORDER — SODIUM CHLORIDE 0.9 % IJ SOLN
3.0000 mL | Freq: Two times a day (BID) | INTRAMUSCULAR | Status: DC
  Administered 2014-08-22 – 2014-08-27 (×6): 3 mL via INTRAVENOUS

## 2014-08-22 MED ORDER — SORBITOL 70 % SOLN
30.0000 mL | Freq: Every day | Status: DC | PRN
  Administered 2014-08-25: 30 mL via ORAL
  Filled 2014-08-22 (×2): qty 30

## 2014-08-22 MED ORDER — CARVEDILOL 3.125 MG PO TABS
3.1250 mg | ORAL_TABLET | Freq: Two times a day (BID) | ORAL | Status: DC
  Filled 2014-08-22: qty 1

## 2014-08-22 MED ORDER — CARBIDOPA-LEVODOPA 25-250 MG PO TABS
1.0000 | ORAL_TABLET | Freq: Three times a day (TID) | ORAL | Status: DC
  Administered 2014-08-22 – 2014-08-28 (×16): 1 via ORAL
  Filled 2014-08-22 (×20): qty 1

## 2014-08-22 MED ORDER — ACETAMINOPHEN 500 MG PO TABS: 500.0000 mg | ORAL_TABLET | ORAL | Status: DC | PRN

## 2014-08-22 MED ORDER — LEVALBUTEROL HCL 0.63 MG/3ML IN NEBU
0.6300 mg | INHALATION_SOLUTION | Freq: Four times a day (QID) | RESPIRATORY_TRACT | Status: DC
  Administered 2014-08-22: 0.63 mg via RESPIRATORY_TRACT
  Filled 2014-08-22: qty 3

## 2014-08-22 MED ORDER — VANCOMYCIN HCL IN DEXTROSE 1-5 GM/200ML-% IV SOLN
1000.0000 mg | INTRAVENOUS | Status: DC
  Administered 2014-08-23: 1000 mg via INTRAVENOUS
  Filled 2014-08-22 (×2): qty 200

## 2014-08-22 MED ORDER — SODIUM CHLORIDE 0.9 % IV SOLN: 250.0000 mL | INTRAVENOUS | Status: DC | PRN

## 2014-08-22 MED ORDER — VANCOMYCIN HCL 10 G IV SOLR
20.0000 mg/kg | Freq: Once | INTRAVENOUS | Status: AC
  Administered 2014-08-22: 1660 mg via INTRAVENOUS
  Filled 2014-08-22: qty 1660

## 2014-08-22 MED ORDER — DOXAZOSIN MESYLATE 8 MG PO TABS
8.0000 mg | ORAL_TABLET | Freq: Every day | ORAL | Status: DC
  Administered 2014-08-22 – 2014-08-27 (×6): 8 mg via ORAL
  Filled 2014-08-22 (×10): qty 1

## 2014-08-22 MED ORDER — FUROSEMIDE 10 MG/ML IJ SOLN
40.0000 mg | Freq: Two times a day (BID) | INTRAMUSCULAR | Status: DC
  Administered 2014-08-23: 40 mg via INTRAVENOUS
  Filled 2014-08-22 (×3): qty 4

## 2014-08-22 MED ORDER — SODIUM CHLORIDE 0.9 % IJ SOLN: 3.0000 mL | INTRAMUSCULAR | Status: DC | PRN

## 2014-08-22 MED ORDER — ONDANSETRON HCL 4 MG/2ML IJ SOLN
4.0000 mg | Freq: Four times a day (QID) | INTRAMUSCULAR | Status: DC | PRN
  Administered 2014-08-24: 4 mg via INTRAVENOUS
  Filled 2014-08-22: qty 2

## 2014-08-22 MED ORDER — HEPARIN (PORCINE) IN NACL 100-0.45 UNIT/ML-% IJ SOLN
1350.0000 [IU]/h | INTRAMUSCULAR | Status: DC
  Administered 2014-08-22: 1050 [IU]/h via INTRAVENOUS
  Administered 2014-08-23 – 2014-08-25 (×3): 1350 [IU]/h via INTRAVENOUS
  Filled 2014-08-22 (×6): qty 250

## 2014-08-22 MED ORDER — ASPIRIN EC 81 MG PO TBEC
81.0000 mg | DELAYED_RELEASE_TABLET | Freq: Every day | ORAL | Status: DC
  Administered 2014-08-22 – 2014-08-28 (×6): 81 mg via ORAL
  Filled 2014-08-22 (×7): qty 1

## 2014-08-22 MED ORDER — ASPIRIN 81 MG PO CHEW: 81.0000 mg | CHEWABLE_TABLET | Freq: Every day | ORAL | Status: DC

## 2014-08-22 MED ORDER — INSULIN GLARGINE 100 UNIT/ML ~~LOC~~ SOLN
14.0000 [IU] | Freq: Every day | SUBCUTANEOUS | Status: DC
  Administered 2014-08-22 – 2014-08-23 (×2): 14 [IU] via SUBCUTANEOUS
  Filled 2014-08-22 (×4): qty 0.14

## 2014-08-22 MED ORDER — ASPIRIN EC 81 MG PO TBEC: 81.0000 mg | DELAYED_RELEASE_TABLET | Freq: Every day | ORAL | Status: DC

## 2014-08-22 MED ORDER — DONEPEZIL HCL 5 MG PO TABS: 5.0000 mg | ORAL_TABLET | Freq: Every day | ORAL | Status: DC

## 2014-08-22 MED ORDER — ENOXAPARIN SODIUM 30 MG/0.3ML ~~LOC~~ SOLN: 30.0000 mg | SUBCUTANEOUS | Status: DC

## 2014-08-22 MED ORDER — IPRATROPIUM BROMIDE 0.02 % IN SOLN: 0.5000 mg | RESPIRATORY_TRACT | Status: DC | PRN

## 2014-08-22 MED ORDER — TAMSULOSIN HCL 0.4 MG PO CAPS: 0.4000 mg | ORAL_CAPSULE | Freq: Every day | ORAL | Status: DC

## 2014-08-22 MED ORDER — LEVALBUTEROL HCL 0.63 MG/3ML IN NEBU
0.6300 mg | INHALATION_SOLUTION | RESPIRATORY_TRACT | Status: DC | PRN
  Administered 2014-08-27: 0.63 mg via RESPIRATORY_TRACT
  Filled 2014-08-22: qty 3

## 2014-08-22 MED ORDER — SODIUM POLYSTYRENE SULFONATE 15 GM/60ML PO SUSP: 15.0000 g | Freq: Every day | ORAL | Status: DC

## 2014-08-22 MED ORDER — SERTRALINE HCL 50 MG PO TABS
50.0000 mg | ORAL_TABLET | Freq: Every day | ORAL | Status: DC
  Administered 2014-08-23 – 2014-08-28 (×5): 50 mg via ORAL
  Filled 2014-08-22 (×6): qty 1

## 2014-08-22 MED ORDER — MAGNESIUM SULFATE 50 % IJ SOLN
3.0000 g | Freq: Once | INTRAVENOUS | Status: AC
  Administered 2014-08-22: 3 g via INTRAVENOUS
  Filled 2014-08-22: qty 6

## 2014-08-22 MED ORDER — PIPERACILLIN-TAZOBACTAM 3.375 G IVPB 30 MIN
3.3750 g | Freq: Once | INTRAVENOUS | Status: AC
  Administered 2014-08-22: 3.375 g via INTRAVENOUS
  Filled 2014-08-22: qty 50

## 2014-08-22 MED ORDER — ACETAMINOPHEN 325 MG PO TABS
650.0000 mg | ORAL_TABLET | ORAL | Status: DC | PRN
Start: 2014-08-22 — End: 2014-08-28
  Filled 2014-08-22: qty 2

## 2014-08-22 MED ORDER — METOPROLOL TARTRATE 12.5 MG HALF TABLET
12.5000 mg | ORAL_TABLET | Freq: Two times a day (BID) | ORAL | Status: DC
  Administered 2014-08-22 – 2014-08-24 (×4): 12.5 mg via ORAL
  Filled 2014-08-22 (×7): qty 1

## 2014-08-22 NOTE — H&P (Signed)
[facility] History and Physical  DIVINE NAGARAJAN GMW:[id number] DOB: May 12, 1925 DOA: 08/22/2014  Referring physician: Dr. Rennis Chris PCP: Verneita Griffes, MD   Chief Complaint: Shortness of breath/hypoxia  HPI: Andrew Dunn is a 81 y.o. male  With history of Parkinson's dementia, hypertension, type 2 diabetes mellitus, chronic kidney disease stage III, history of CHF, remote tobacco history quit in 2009, [facility] resident [facility], who presents to the ED with sudden onset hypoxia with some shortness of breath, coughing, wheezing and congestion. Patient with a history of dementia and a such that history was obtained from patient's daughter as well as per ED physician. Per daughter over the past weekend patient has had worsening lower extremity edema with urinary retention and a Foley catheter was placed. Patient's daughter stated that the night prior to admission patient had a low-grade temp of 100.2. Patient denied any chest pain no shortness of breath no abdominal pain no dysuria. Patient's daughter states patient has had decreased oral intake with generalized weakness. He was also noted that patient pulled out his Foley catheter on the day of admission. On the day of admission patient ate his lunch only ate a small bite and drank a little bit of his drink. Patient was subsequently noted later on to have some coughing with some wheezing and some congestion noted by nursing that facility. Patient also noted with emesis. Patient was seen in the emergency room and noted to have a temp of 102.2. Patient was noted to be tachycardic heart rate as high as 113 and tachypnea With a respiratory rate as high as 37. ABG which was obtained had a pH of 7.34, PCO2 of 47, PO2 of 150. CBC obtained had a white count of 16.2 hemoglobin of 11.3 otherwise was within normal limits. Basic metabolic profile had a potassium of 3.0 BUN of 40 creatinine of 1.96 otherwise was within normal limits. BNP was  elevated at 772.4. First set of point-of-care troponin was elevated at 1.11. Lactic acid level is 1.47. Chest x-ray showed mild enlargement of cardiopericardial silhouette without edema. Stable elevation of right hemidiaphragm with mild atelectasis at the right lung base. Atherosclerotic aortic arch. Urinalysis done was cloudy many bacteria large leukocytes, negative nitrite, greater than 300 protein, too numerous to count RBCs, too numerous to count WBCs. Patient was given a dose of IV antibiotics. [facility] were called to admit the patient for further evaluation and management.   Review of Systems: As per history of present illness otherwise negative. Patient also noted to be demented.  Constitutional:  No weight loss, night sweats, Fevers, chills, fatigue.  HEENT:  No headaches, Difficulty swallowing,Tooth/dental problems,Sore throat,  No sneezing, itching, ear ache, nasal congestion, post nasal drip,  Cardio-vascular:  No chest pain, Orthopnea, PND, swelling in lower extremities, anasarca, dizziness, palpitations  GI:  No heartburn, indigestion, abdominal pain, nausea, vomiting, diarrhea, change in bowel habits, loss of appetite  Resp:  No shortness of breath with exertion or at rest. No excess mucus, no productive cough, No non-productive cough, No coughing up of blood.No change in color of mucus.No wheezing.No chest wall deformity  Skin:  no rash or lesions.  GU:  no dysuria, change in color of urine, no urgency or frequency. No flank pain.  Musculoskeletal:  No joint pain or swelling. No decreased range of motion. No back pain.  Psych:  No change in mood or affect. No depression or anxiety. No memory loss.   Past Medical History  Diagnosis Date  .  Hypertension   . Diabetes mellitus   . Anxiety   . Renal disorder   . Parkinson disease   . Lumbar radiculopathy   . Osteopenia   . CHF (congestive heart failure)    Past Surgical History  Procedure Laterality Date  .  Rotator cuff repair    . Left toenail removed    . Cholecystectomy     Social History:  reports that he quit smoking about 6 years ago. His smoking use included Cigarettes. He has a 35 pack-year smoking history. He has never used smokeless tobacco. He reports that he does not drink alcohol or use illicit drugs.  No Known Allergies  Family History  Problem Relation Age of Onset  . Heart attack Father   . Parkinson's disease Mother    per patient's daughter.  Prior to Admission medications   Medication Sig Start Date End Date Taking? Authorizing Provider  acetaminophen (TYLENOL) 500 MG tablet Take 500 mg by mouth at bedtime.   Yes Historical Provider, MD  acetaminophen (TYLENOL) 500 MG tablet Take 500 mg by mouth every 4 (four) hours as needed for mild pain or moderate pain.   Yes Historical Provider, MD  aspirin EC 81 MG tablet Take 81 mg by mouth daily.     Yes Historical Provider, MD  carbidopa-levodopa (SINEMET) 25-250 MG per tablet Take 1 tablet by mouth 3 (three) times daily.     Yes Historical Provider, MD  doxazosin (CARDURA) 8 MG tablet Take 8 mg by mouth at bedtime.     Yes Historical Provider, MD  furosemide (LASIX) 40 MG tablet Take 40 mg by mouth daily.     Yes Historical Provider, MD  LANTUS 100 UNIT/ML injection Inject 14 Units into the skin at bedtime. 08/07/14  Yes Historical Provider, MD  mirtazapine (REMERON) 15 MG tablet Take 15 mg by mouth at bedtime.   Yes Historical Provider, MD  Multiple Vitamin (MULITIVITAMIN WITH MINERALS) TABS Take 1 tablet by mouth daily.     Yes Historical Provider, MD  nitroGLYCERIN (NITROSTAT) 0.4 MG SL tablet Place 0.4 mg under the tongue every 5 (five) minutes as needed for chest pain.   Yes Historical Provider, MD  NOVOLOG 100 UNIT/ML injection Inject 7 Units into the skin 2 (two) times daily after a meal. 08/07/14  Yes Historical Provider, MD  sertraline (ZOLOFT) 50 MG tablet Take 50 mg by mouth daily. 08/06/14  Yes Historical Provider, MD    sodium polystyrene (KAYEXALATE) 15 GM/60ML suspension Take 15 g by mouth daily.   Yes Historical Provider, MD  Vitamin D, Ergocalciferol, (DRISDOL) 50000 UNITS CAPS Take 50,000 Units by mouth every 30 (thirty) days.    Yes Historical Provider, MD  donepezil (ARICEPT) 5 MG tablet Take 1 tablet (5 mg total) by mouth at bedtime. 05/20/11 05/19/12  Benjaman Kindler, MD  potassium chloride SA (K-DUR,KLOR-CON) 20 MEQ tablet Take 1 tablet (20 mEq total) by mouth daily. 05/20/11 05/19/12  Benjaman Kindler, MD   Physical Exam: Filed Vitals:   08/22/14 1700 08/22/14 1720 08/22/14 1745 08/22/14 1800  BP: 136/59  139/59   Pulse: 103  113   Temp:      TempSrc:      Resp: 33  31   Height:    5' 6.93" (1.7 m)  Weight:  86.637 kg (191 lb)    SpO2: 98%  99%     Wt Readings from Last 3 Encounters:  08/22/14 86.637 kg (191 lb)  05/19/11 83.3 kg (183 lb 10.3  oz)    General:   well-developed laying on gurney in no acute cardiopulmonary distress.  Eyes: PERRLA, EOMI, normal lids, irises & conjunctiva ENT: grossly normal hearing, lips & tongue Neck: no LAD, masses or thyromegaly.+jvd Cardiovascular:  tachycardic. Positive JVD. 2-3+ bilateral lower extremity edema halfway up his thighs. Telemetry: Sinus tachycardia  Respiratory: Diffuse wet crackles, minimal to mild wheezing, rhonchorous breath sounds.  Abdomen: soft, mildly distended, positive bowel sounds, no rebound, no guarding.  Skin: No induration Musculoskeletal: grossly normal tone BUE/BLE Psychiatric: grossly normal mood and affect, speech fluent and appropriate Neurologic:  demented. CN 2- 12 are grossly intact. No focal deficits.           Labs on Admission:  Basic Metabolic Panel:  Recent Labs Lab 08/22/14 1546  NA 138  K 3.0*  CL 101  CO2 26  GLUCOSE 305*  BUN 40*  CREATININE 1.96*  CALCIUM 7.9*   Liver Function Tests: No results for input(s): AST, ALT, ALKPHOS, BILITOT, PROT, ALBUMIN in the last 168 hours. No results for input(s):  LIPASE, AMYLASE in the last 168 hours. No results for input(s): AMMONIA in the last 168 hours. CBC:  Recent Labs Lab 08/22/14 1546  WBC 16.2*  HGB 11.3*  HCT 35.2*  MCV 85.9  PLT 200   Cardiac Enzymes: No results for input(s): CKTOTAL, CKMB, CKMBINDEX, TROPONINI in the last 168 hours.  BNP (last 3 results)  Recent Labs  08/22/14 1546  BNP 772.4*    ProBNP (last 3 results) No results for input(s): PROBNP in the last 8760 hours.  CBG: No results for input(s): GLUCAP in the last 168 hours.  Radiological Exams on Admission: Dg Chest Port 1 View  08/22/2014   CLINICAL DATA:  Shortness of breath.  Diabetes.  EXAM: PORTABLE CHEST - 1 VIEW  COMPARISON:  05/17/2011  FINDINGS: Elevated right hemidiaphragm. Mild enlargement of the cardiopericardial silhouette atherosclerotic aortic arch.  Mild atelectasis noted along the right hemidiaphragm. No overt edema identified.  IMPRESSION: 1. Mild enlargement of the cardiopericardial silhouette, without edema. 2. Stable elevation of the right hemidiaphragm, with mild atelectasis at the right lung base. 3. Atherosclerotic aortic arch.   Electronically Signed   By: Gaylyn Rong M.D.   On: 08/22/2014 17:28    EKG: Independently reviewed. Sinus tachycardia. New incomplete right bundle branch block with left anterior fascicular block.   Assessment/Plan Principal Problem:   Sepsis Active Problems:   Dementia   Diabetes mellitus   Hypokalemia   Parkinson's disease   Pernicious anemia   Osteopenia   Acute respiratory failure with hypoxia   Acute exacerbation of CHF (congestive heart failure)   HCAP (healthcare-associated pneumonia)   Elevated troponin   UTI (lower urinary tract infection)   Acute renal failure superimposed on stage 3 chronic kidney disease   Leukocytosis   Abnormal EKG   Urinary retention   #1 sepsis Likely secondary to urinary tract infection plus or minus healthcare associated pneumonia/aspiration pneumonia.  Will panculture the patient. Check a lactic acid level. Check a pro calcitonin level. Patient noted to be in volume overload and as such will hold off on IV fluids. Will place empirically on IV vancomycin IV cefepime. Follow.  #2 acute respiratory failure with hypoxia Likely secondary to an acute CHF exacerbation as patient was noted to be volume overloaded on clinical examination plus or minus healthcare associated pneumonia/aspiration pneumonia. Patient was noted to be hypoxic on room air. ABG which was done shows a pH of 7.32 PCO2 of  47 PO2 of 150 with a bicarbonate of 25. Will repeat chest x-ray in the morning to see for pneumonia fluffs out. Cycle cardiac enzymes every 6 hours 3. Patient noted to have elevated point-of-care troponin. Patient is also demented and as such likely unable to state if he is a significant chest pain. EKG shows a new incomplete right bundle branch block with a left anterior fascicular block. Will check a 2-D echo. Check a TSH. Place on Lasix 40 mg IV every 12 hours. Strict I's and O's. Daily weights. Consult cardiology for further evaluation and management.  #3 elevated troponin/abnormal EKG Patient noted to have elevated point-of-care troponin at 1.11. Patient noted to be in acute CHF exacerbation. Patient does have multiple risk factors of hypertension diabetes a prior history of congestive heart failure. We'll cycle cardiac enzymes every 6 hours 3. Check a 2-D echo. Check a TSH. Place on a baby aspirin. Place on full dose heparin. Place on low-dose Coreg. Cardiology consultation.  #4 acute CHF exacerbation Questionable etiology. Concern for acute coronary syndrome as point-of-care troponin is elevated at 1.11. Check a TSH. Cycle cardiac enzymes every 6 hours 3. Check a 2-D echo. Strict I's and O's. Daily weights. Lasix 40 mg IV every 12 hours. Place on low-dose aspirin. Low-dose beta blocker of Coreg. Cardiology consultation.  #5 urinary tract infection Check urine  cultures. Patient be placed empirically on IV cefepime. Follow.  #6 acute on chronic kidney disease stage III Baseline creatinine ranges from 1.3-1.5. Likely secondary to a prerenal azotemia secondary to acute CHF exacerbation. Check a fractional excretion of sodium. Check a renal ultrasound. Place on IV Lasix. Hold ACE inhibitor. Follow.  #7 hypokalemia Secondary to diuretics. Check a magnesium level. Replete.  #8 probable healthcare associated pneumonia/probable aspiration pneumonia Patient with a history of Parkinson's disease. Patient noted to have some coughing after his lunch. Patient presented with sepsis cough and upper respiratory symptoms concerning for possible pneumonia. Chest x-ray is negative however likely lagging. Check a sputum Gram stain and culture. Check a urine Legionella antigen. Check a urine pneumococcus antigen. Speech therapy evaluation. Check blood cultures 2. Placed empirically on IV vancomycin IV cefepime. Follow.  #9 diabetes mellitus Check a hemoglobin A1c. Hold oral hypoglycemic agents. Place on a sliding scale insulin. Follow.  #10 Parkinson's disease Continue home regimen of Sinemet.  #11 urinary retention Maintain Foley catheter. Will place on Flomax. Follow.  #12 leukocytosis Likely secondary to UTI and probable pneumonia. Will panculture. Placed empirically on IV vancomycin and IV cefepime. Follow.  #13 prophylaxis PPI for GI prophylaxis. Heparin for DVT prophylaxis.   Code Status: DO NOT RESUSCITATE DVT Prophylaxis: Heparin Family Communication: Updated patient and daughter at bedside. Disposition Plan: Admit to stepdown unit.  Time spent: 4 MINS  [facility] MD [facility] Pager [phone]

## 2014-08-22 NOTE — ED Notes (Addendum)
Per the facility notes, patient has had a change in his condition over the weekend with foley catheter placement due to urinary retention.  Patient pulled out his foley this morning.  opatient has had increased edema to both legs over the weekend as well.   At 10am they noticed increased resp rate, so EMS was called to nursing home.

## 2014-08-22 NOTE — Progress Notes (Signed)
ANTICOAGULATION CONSULT NOTE - Initial Consult  Pharmacy Consult for Heparin Indication: chest pain/ACS  No Known Allergies  Patient Measurements: Height: 5' 6.93" (170 cm) Weight: 191 lb (86.637 kg) IBW/kg (Calculated) : 65.94 Heparin Dosing Weight: 83.7 kg  Vital Signs: Temp: 102.2 F (39 C) (05/18 1528) Temp Source: Rectal (05/18 1528) BP: 139/59 mmHg (05/18 1745) Pulse Rate: 113 (05/18 1745)  Labs:  Recent Labs  08/22/14 1546  HGB 11.3*  HCT 35.2*  PLT 200  LABPROT 14.7  INR 1.13  CREATININE 1.96*    CrCl cannot be calculated (Unknown ideal weight.).   Medical History: Past Medical History  Diagnosis Date  . Hypertension   . Diabetes mellitus   . Anxiety   . Renal disorder   . Parkinson disease   . Lumbar radiculopathy   . Osteopenia   . CHF (congestive heart failure)     Medications:  Scheduled:  . aspirin  81 mg Oral Daily  . furosemide  40 mg Intravenous Q12H  . potassium chloride  40 mEq Oral Q4H  . vancomycin  20 mg/kg (Order-Specific) Intravenous Once   Infusions:    Assessment: 81 yo M presenting to ED on 08/22/2014 with worsening SOB. On admission troponin elevated to 1.11 and pharmacy consulted to dose heparin for possible ACS. H/H slightly low, plt wnl with no reported s/s bleeding. SCr elevated at 1.96 (CrCl ~28 ml/min).   Goal of Therapy:  Heparin level 0.3-0.7 units/ml Monitor platelets by anticoagulation protocol: Yes   Plan:  - Initiate heparin 4000 units x 1, followed by 1050 units/hr (~13 u/kg/hr) - 8 hour HL followed by daily HL/CBC - Monitor for s/s bleeding  - F/u plans for cath  [facility] K. Bonnye Fava, PharmD, BCPS Clinical Pharmacist - Resident Pager: [phone] Pharmacy: [phone] 08/22/2014 6:56 PM

## 2014-08-22 NOTE — Consult Note (Signed)
Admit date: 08/22/2014 Referring Physician  Dr. Janee Morn Primary Physician  Dr. Arlyce Dice Primary Cardiologist  None Reason for Consultation  CHF  HPI: Andrew Dunn is an 81 y.o. male with a history of Parkinson's dementia, hypertension, type 2 diabetes mellitus, chronic kidney disease stage III, history of CHF, remote tobacco history quit in 2009, [facility] resident [facility], who presents to the ED with sudden onset hypoxia with some shortness of breath, coughing, wheezing and congestion. Patient has a history of dementia and  history was obtained from patient's daughter as well as per ED physician. Per daughter over the past weekend patient has had worsening lower extremity edema with urinary retention and a Foley catheter was placed. Patient's daughter stated that the night prior to admission patient had a low-grade temp of 100.2. Patient denied any chest pain no shortness of breath no abdominal pain no dysuria. Patient's daughter states patient has had decreased oral intake with generalized weakness. He was also noted that patient pulled out his Foley catheter on the day of admission. On the day of admission patient ate his lunch only ate a small bite and drank a little bit of his drink. Patient was subsequently noted later on to have some coughing with some wheezing and some congestion noted by nursing that facility. Patient also noted with emesis. Patient was seen in the emergency room and noted to have a temp of 102.2. Patient was noted to be tachycardic heart rate as high as 113 and tachypnea With a respiratory rate as high as 37. ABG which was obtained had a pH of 7.34, PCO2 of 47, PO2 of 150. CBC obtained had a white count of 16.2 hemoglobin of 11.3 otherwise was within normal limits. Basic metabolic profile had a potassium of 3.0 BUN of 40 creatinine of 1.96 otherwise was within normal limits. BNP was elevated at 772.4. First set of point-of-care troponin was elevated at 1.11. Lactic  acid level is 1.47. Chest x-ray showed mild enlargement of cardiopericardial silhouette without edema. Stable elevation of right hemidiaphragm with mild atelectasis at the right lung base. Atherosclerotic aortic arch. Urinalysis done was cloudy many bacteria large leukocytes, negative nitrite, greater than 300 protein, too numerous to count RBCs, too numerous to count WBCs. Patient was given a dose of IV antibiotics for possible urosepsis. [facility] hospitalists were called to admit the patient for further evaluation and management and they have requested an Cardiology consult for elevated troponin and possible acute CHF.       PMH:   Past Medical History  Diagnosis Date  . Hypertension   . Diabetes mellitus   . Anxiety   . Renal disorder   . Parkinson disease   . Lumbar radiculopathy   . Osteopenia   . CHF (congestive heart failure)      PSH:   Past Surgical History  Procedure Laterality Date  . Rotator cuff repair    . Left toenail removed    . Cholecystectomy      Allergies:  Review of patient's allergies indicates no known allergies. Prior to Admit Meds:   Prescriptions prior to admission  Medication Sig Dispense Refill Last Dose  . acetaminophen (TYLENOL) 500 MG tablet Take 500 mg by mouth at bedtime.   08/21/2014 at Unknown time  . acetaminophen (TYLENOL) 500 MG tablet Take 500 mg by mouth every 4 (four) hours as needed for mild pain or moderate pain.   08/22/2014 at Unknown time  . aspirin EC 81 MG tablet Take  81 mg by mouth daily.     08/22/2014 at Unknown time  . carbidopa-levodopa (SINEMET) 25-250 MG per tablet Take 1 tablet by mouth 3 (three) times daily.     08/22/2014 at Unknown time  . doxazosin (CARDURA) 8 MG tablet Take 8 mg by mouth at bedtime.     08/21/2014 at Unknown time  . furosemide (LASIX) 40 MG tablet Take 40 mg by mouth daily.     08/22/2014 at Unknown time  . LANTUS 100 UNIT/ML injection Inject 14 Units into the skin at bedtime.   08/21/2014 at Unknown time  .  mirtazapine (REMERON) 15 MG tablet Take 15 mg by mouth at bedtime.   08/21/2014 at Unknown time  . Multiple Vitamin (MULITIVITAMIN WITH MINERALS) TABS Take 1 tablet by mouth daily.     08/22/2014 at Unknown time  . nitroGLYCERIN (NITROSTAT) 0.4 MG SL tablet Place 0.4 mg under the tongue every 5 (five) minutes as needed for chest pain.   over 30 days  . NOVOLOG 100 UNIT/ML injection Inject 7 Units into the skin 2 (two) times daily after a meal.   08/21/2014 at Unknown time  . sertraline (ZOLOFT) 50 MG tablet Take 50 mg by mouth daily.   08/22/2014 at Unknown time  . sodium polystyrene (KAYEXALATE) 15 GM/60ML suspension Take 15 g by mouth daily.   08/21/2014 at Unknown time  . Vitamin D, Ergocalciferol, (DRISDOL) 50000 UNITS CAPS Take 50,000 Units by mouth every 30 (thirty) days.    08/22/2014 at Unknown time  . donepezil (ARICEPT) 5 MG tablet Take 1 tablet (5 mg total) by mouth at bedtime.     . potassium chloride SA (K-DUR,KLOR-CON) 20 MEQ tablet Take 1 tablet (20 mEq total) by mouth daily.      Fam HX:    Family History  Problem Relation Age of Onset  . Heart attack Father   . Parkinson's disease Mother    Social HX:    History   Social History  . Marital Status: Widowed    Spouse Name: N/A  . Number of Children: N/A  . Years of Education: N/A   Occupational History  . Not on file.   Social History Main Topics  . Smoking status: Former Smoker -- 1.00 packs/day for 35 years    Types: Cigarettes    Quit date: 03/06/2008  . Smokeless tobacco: Never Used  . Alcohol Use: No  . Drug Use: No  . Sexual Activity: No   Other Topics Concern  . Not on file   Social History Narrative   ** Merged History Encounter **         ROS:  All 11 ROS were addressed and are negative except what is stated in the HPI  Physical Exam: Blood pressure 146/57, pulse 108, temperature 99.4 F (37.4 C), temperature source Oral, resp. rate 34, height 5' 6.93" (1.7 m), weight 201 lb 1.6 oz (91.218 kg), SpO2  96 %.    General: Well developed, well nourished, in no acute distress Head: Eyes PERRLA, No xanthomas.   Normal cephalic and atramatic  Lungs:   Diffuse expiratory and inspiratory wheezes Heart:   HRRR S1 S2 Pulses are 2+ & equal.            No carotid bruit. No JVD.  No abdominal bruits. No femoral bruits. Abdomen: Bowel sounds are positive, abdomen soft and non-tender without masses  Extremities:   No clubbing, cyanosis or edema.  DP +1, 2+ pitting edema bilaterally Neuro: Alert  and oriented X 3. Psych:  Good affect, responds appropriately    Labs:   Lab Results  Component Value Date   WBC 16.2* 08/22/2014   HGB 11.3* 08/22/2014   HCT 35.2* 08/22/2014   MCV 85.9 08/22/2014   PLT 200 08/22/2014    Recent Labs Lab 08/22/14 1546  NA 138  K 3.0*  CL 101  CO2 26  BUN 40*  CREATININE 1.96*  CALCIUM 7.9*  GLUCOSE 305*   No results found for: PTT Lab Results  Component Value Date   INR 1.13 08/22/2014   Lab Results  Component Value Date   CKTOTAL 187 04/13/2011   CKMB 4.7* 04/13/2011   TROPONINI <0.30 04/13/2011    No results found for: CHOL No results found for: HDL No results found for: LDLCALC No results found for: TRIG No results found for: CHOLHDL No results found for: LDLDIRECT    Radiology:  Dg Chest Port 1 View  08/22/2014   CLINICAL DATA:  Shortness of breath.  Diabetes.  EXAM: PORTABLE CHEST - 1 VIEW  COMPARISON:  05/17/2011  FINDINGS: Elevated right hemidiaphragm. Mild enlargement of the cardiopericardial silhouette atherosclerotic aortic arch.  Mild atelectasis noted along the right hemidiaphragm. No overt edema identified.  IMPRESSION: 1. Mild enlargement of the cardiopericardial silhouette, without edema. 2. Stable elevation of the right hemidiaphragm, with mild atelectasis at the right lung base. 3. Atherosclerotic aortic arch.   Electronically Signed   By: Gaylyn Rong M.D.   On: 08/22/2014 17:28    EKG:  Sinus tachycardia with IRBBB, LAFB  and slight J point elevation in V3, I and aVL with PVC's.  ASSESSMENT/PLAN:  1.  Acute respiratory failure most likely secondary to aspiration +/- CHF.  He has diffuse inspiratory and expiratory wheezes on exam.  No edema noted on chest xray although he does appear volume overloaded with elevated BNP and LE edema.    2.  Sepsis most likely secondary to UTI +/- PNA - per TR[facility] 3.  Elevated troponin of unclear etiology.  I suspect this is due to demand ischemia in the setting of sepsis syndrome and possible aspiration PNA +/- CHF as well as reduced clearance from acute on chronic renal failure.  His EKG shows some isolated J point elevation in I and aVL and V3 but he denies any chest pain or pressure now or at any time recently.  Continue to cycle Troponin until it peaks.  Would give full dose Heparin until enzymes cycled.  Check 2D echo in am to assess LVF.  ASA.  Would be cautious with BB use given active wheezing.  Would consider changing to a B1 selective agent such as lopressor.   4.  Acute on chronic renal insufficiency stage III.  Started on diuretics - will need to follow renal function closely.  Hopefully will improve with diuresis.   5.  Acute CHF with increased LE edema and elevated BNP.  Continue IV diuretics and follow renal function closely.  Check 2D echo to assess LVF     Quintella Reichert, MD  08/22/2014  7:39 PM

## 2014-08-22 NOTE — ED Provider Notes (Signed)
CSN: [id number]     Arrival date & time 08/22/14  1417 History   First MD Initiated Contact with Patient 08/22/14 1437     Chief Complaint  Patient presents with  . Shortness of Breath  . Chest Pain     (Consider location/radiation/quality/duration/timing/severity/associated sxs/prior Treatment) Patient is a 81 y.o. male presenting with shortness of breath. The history is provided by the nursing home and the EMS personnel. The history is limited by the condition of the patient.  Shortness of Breath Severity:  Moderate Onset quality:  Gradual Duration:  2 days Timing:  Constant Progression:  Worsening Chronicity:  New Context comment:  Aspiration event just prior to presentation Relieved by:  Nothing Worsened by:  Nothing tried Ineffective treatments:  None tried Associated symptoms: cough and wheezing   Associated symptoms: no abdominal pain, no chest pain, no diaphoresis, no fever, no headaches, no hemoptysis, no neck pain, no rash, no sputum production, no syncope and no vomiting   Associated symptoms comment:  Increased LE edema per SNF Cough:    Cough characteristics:  Non-productive   Sputum characteristics:  Nondescript   Severity:  Moderate   Onset quality:  Gradual   Duration:  2 days   Timing:  Constant   Progression:  Unchanged   Chronicity:  New   Past Medical History  Diagnosis Date  . Hypertension   . Diabetes mellitus   . Anxiety   . Renal disorder   . Parkinson disease   . Lumbar radiculopathy   . Osteopenia   . CHF (congestive heart failure)    Past Surgical History  Procedure Laterality Date  . Rotator cuff repair    . Left toenail removed    . Cholecystectomy     Family History  Problem Relation Age of Onset  . Heart attack Father   . Parkinson's disease Mother    History  Substance Use Topics  . Smoking status: Former Smoker -- 1.00 packs/day for 35 years    Types: Cigarettes    Quit date: 03/06/2008  . Smokeless tobacco: Never Used   . Alcohol Use: No    Review of Systems  Constitutional: Positive for fatigue. Negative for fever, chills and diaphoresis.  Respiratory: Positive for cough, shortness of breath and wheezing. Negative for hemoptysis and sputum production.   Cardiovascular: Positive for leg swelling. Negative for chest pain, palpitations and syncope.  Gastrointestinal: Negative for nausea, vomiting, abdominal pain, diarrhea and abdominal distention.  Genitourinary: Positive for dysuria and hematuria. Negative for flank pain and difficulty urinating.  Musculoskeletal: Negative for back pain, neck pain and neck stiffness.  Skin: Negative for color change, pallor and rash.  Neurological: Positive for weakness (generalized). Negative for dizziness, syncope, light-headedness, numbness and headaches.  All other systems reviewed and are negative.     Allergies  Review of patient's allergies indicates no known allergies.  Home Medications   Prior to Admission medications   Medication Sig Start Date End Date Taking? Authorizing Provider  acetaminophen (TYLENOL) 500 MG tablet Take 500 mg by mouth at bedtime.   Yes Historical Provider, MD  acetaminophen (TYLENOL) 500 MG tablet Take 500 mg by mouth every 4 (four) hours as needed for mild pain or moderate pain.   Yes Historical Provider, MD  aspirin EC 81 MG tablet Take 81 mg by mouth daily.     Yes Historical Provider, MD  carbidopa-levodopa (SINEMET) 25-250 MG per tablet Take 1 tablet by mouth 3 (three) times daily.  Yes Historical Provider, MD  doxazosin (CARDURA) 8 MG tablet Take 8 mg by mouth at bedtime.     Yes Historical Provider, MD  furosemide (LASIX) 40 MG tablet Take 40 mg by mouth daily.     Yes Historical Provider, MD  LANTUS 100 UNIT/ML injection Inject 14 Units into the skin at bedtime. 08/07/14  Yes Historical Provider, MD  mirtazapine (REMERON) 15 MG tablet Take 15 mg by mouth at bedtime.   Yes Historical Provider, MD  Multiple Vitamin  (MULITIVITAMIN WITH MINERALS) TABS Take 1 tablet by mouth daily.     Yes Historical Provider, MD  nitroGLYCERIN (NITROSTAT) 0.4 MG SL tablet Place 0.4 mg under the tongue every 5 (five) minutes as needed for chest pain.   Yes Historical Provider, MD  NOVOLOG 100 UNIT/ML injection Inject 7 Units into the skin 2 (two) times daily after a meal. 08/07/14  Yes Historical Provider, MD  sertraline (ZOLOFT) 50 MG tablet Take 50 mg by mouth daily. 08/06/14  Yes Historical Provider, MD  sodium polystyrene (KAYEXALATE) 15 GM/60ML suspension Take 15 g by mouth daily.   Yes Historical Provider, MD  Vitamin D, Ergocalciferol, (DRISDOL) 50000 UNITS CAPS Take 50,000 Units by mouth every 30 (thirty) days.    Yes Historical Provider, MD  donepezil (ARICEPT) 5 MG tablet Take 1 tablet (5 mg total) by mouth at bedtime. 05/20/11 05/19/12  Benjaman Kindler, MD  potassium chloride SA (K-DUR,KLOR-CON) 20 MEQ tablet Take 1 tablet (20 mEq total) by mouth daily. 05/20/11 05/19/12  Benjaman Kindler, MD   BP 143/63 mmHg  Pulse 68  Temp(Src) 99 F (37.2 C) (Oral)  Resp 21  Ht 5' 6.93" (1.7 m)  Wt 195 lb 3.2 oz (88.542 kg)  BMI 30.64 kg/m2  SpO2 95% Physical Exam  Constitutional: He appears well-developed and well-nourished. No distress.  HENT:  Head: Normocephalic and atraumatic.  Mouth/Throat: Oropharynx is clear and moist.  Eyes: Conjunctivae and EOM are normal. Pupils are equal, round, and reactive to light.  Neck: Normal range of motion. Neck supple.  Cardiovascular: Regular rhythm, normal heart sounds and intact distal pulses.  Tachycardia present.  Exam reveals no gallop and no friction rub.   No murmur heard. Pulmonary/Chest: No accessory muscle usage. Tachypnea noted. No respiratory distress. He has no decreased breath sounds. He has wheezes. He has rhonchi. He has no rales.  Abdominal: Soft. Bowel sounds are normal. He exhibits no distension. There is no tenderness. There is no rebound and no guarding.  Musculoskeletal: Normal  range of motion. He exhibits edema (2+ b/ LE edema). He exhibits no tenderness.  Neurological: He is alert. He has normal strength. He is disoriented. No cranial nerve deficit or sensory deficit. He exhibits normal muscle tone. Coordination normal. GCS eye subscore is 4. GCS verbal subscore is 5. GCS motor subscore is 6.  Dementia- confusion at baseline  Skin: He is not diaphoretic.  Nursing note and vitals reviewed.   ED Course  Procedures (including critical care time) Labs Review Labs Reviewed  CBC - Abnormal; Notable for the following:    WBC 16.2 (*)    RBC 4.10 (*)    Hemoglobin 11.3 (*)    HCT 35.2 (*)    All other components within normal limits  BRAIN NATRIURETIC PEPTIDE - Abnormal; Notable for the following:    B Natriuretic Peptide 772.4 (*)    All other components within normal limits  BASIC METABOLIC PANEL - Abnormal; Notable for the following:    Potassium 3.0 (*)  Glucose, Bld 305 (*)    BUN 40 (*)    Creatinine, Ser 1.96 (*)    Calcium 7.9 (*)    GFR calc non Af Amer 29 (*)    GFR calc Af Amer 34 (*)    All other components within normal limits  URINALYSIS, ROUTINE W REFLEX MICROSCOPIC - Abnormal; Notable for the following:    Color, Urine RED (*)    APPearance CLOUDY (*)    Hgb urine dipstick LARGE (*)    Ketones, ur 15 (*)    Protein, ur >300 (*)    Leukocytes, UA LARGE (*)    All other components within normal limits  URINE MICROSCOPIC-ADD ON - Abnormal; Notable for the following:    Squamous Epithelial / LPF FEW (*)    Bacteria, UA MANY (*)    Casts GRANULAR CAST (*)    Crystals TRIPLE PHOSPHATE CRYSTALS (*)    All other components within normal limits  COMPREHENSIVE METABOLIC PANEL - Abnormal; Notable for the following:    Chloride 99 (*)    Glucose, Bld 295 (*)    BUN 41 (*)    Creatinine, Ser 2.14 (*)    Calcium 7.8 (*)    Total Protein 6.1 (*)    Albumin 2.6 (*)    ALT 14 (*)    GFR calc non Af Amer 26 (*)    GFR calc Af Amer 30 (*)     All other components within normal limits  HEMOGLOBIN A1C - Abnormal; Notable for the following:    Hgb A1c MFr Bld 8.3 (*)    All other components within normal limits  BASIC METABOLIC PANEL - Abnormal; Notable for the following:    Glucose, Bld 190 (*)    BUN 45 (*)    Creatinine, Ser 2.31 (*)    Calcium 8.0 (*)    GFR calc non Af Amer 24 (*)    GFR calc Af Amer 28 (*)    All other components within normal limits  TSH - Abnormal; Notable for the following:    TSH 0.161 (*)    All other components within normal limits  TROPONIN I - Abnormal; Notable for the following:    Troponin I 2.74 (*)    All other components within normal limits  TROPONIN I - Abnormal; Notable for the following:    Troponin I 4.26 (*)    All other components within normal limits  HEPARIN LEVEL (UNFRACTIONATED) - Abnormal; Notable for the following:    Heparin Unfractionated <0.10 (*)    All other components within normal limits  CBC - Abnormal; Notable for the following:    WBC 13.5 (*)    RBC 3.67 (*)    Hemoglobin 10.2 (*)    HCT 31.3 (*)    All other components within normal limits  GLUCOSE, CAPILLARY - Abnormal; Notable for the following:    Glucose-Capillary 265 (*)    All other components within normal limits  GLUCOSE, CAPILLARY - Abnormal; Notable for the following:    Glucose-Capillary 360 (*)    All other components within normal limits  GLUCOSE, CAPILLARY - Abnormal; Notable for the following:    Glucose-Capillary 194 (*)    All other components within normal limits  COMPREHENSIVE METABOLIC PANEL - Abnormal; Notable for the following:    Glucose, Bld 190 (*)    BUN 46 (*)    Creatinine, Ser 2.33 (*)    Calcium 7.7 (*)    Total Protein 5.3 (*)  Albumin 2.1 (*)    ALT 5 (*)    GFR calc non Af Amer 24 (*)    GFR calc Af Amer 27 (*)    All other components within normal limits  MAGNESIUM - Abnormal; Notable for the following:    Magnesium 2.6 (*)    All other components within normal  limits  TROPONIN I - Abnormal; Notable for the following:    Troponin I 4.44 (*)    All other components within normal limits  TROPONIN I - Abnormal; Notable for the following:    Troponin I 3.66 (*)    All other components within normal limits  GLUCOSE, CAPILLARY - Abnormal; Notable for the following:    Glucose-Capillary 180 (*)    All other components within normal limits  CBC WITH DIFFERENTIAL/PLATELET - Abnormal; Notable for the following:    WBC 12.5 (*)    RBC 3.82 (*)    Hemoglobin 10.4 (*)    HCT 32.6 (*)    Neutrophils Relative % 90 (*)    Neutro Abs 11.2 (*)    Lymphocytes Relative 7 (*)    Monocytes Relative 2 (*)    All other components within normal limits  COMPREHENSIVE METABOLIC PANEL - Abnormal; Notable for the following:    Glucose, Bld 152 (*)    BUN 50 (*)    Creatinine, Ser 2.73 (*)    Calcium 8.3 (*)    Total Protein 6.1 (*)    Albumin 2.3 (*)    ALT 6 (*)    GFR calc non Af Amer 20 (*)    GFR calc Af Amer 23 (*)    All other components within normal limits  MAGNESIUM - Abnormal; Notable for the following:    Magnesium 2.5 (*)    All other components within normal limits  GLUCOSE, CAPILLARY - Abnormal; Notable for the following:    Glucose-Capillary 176 (*)    All other components within normal limits  GLUCOSE, CAPILLARY - Abnormal; Notable for the following:    Glucose-Capillary 257 (*)    All other components within normal limits  TROPONIN I - Abnormal; Notable for the following:    Troponin I 3.27 (*)    All other components within normal limits  GLUCOSE, CAPILLARY - Abnormal; Notable for the following:    Glucose-Capillary 154 (*)    All other components within normal limits  GLUCOSE, CAPILLARY - Abnormal; Notable for the following:    Glucose-Capillary 195 (*)    All other components within normal limits  GLUCOSE, CAPILLARY - Abnormal; Notable for the following:    Glucose-Capillary 320 (*)    All other components within normal limits   GLUCOSE, CAPILLARY - Abnormal; Notable for the following:    Glucose-Capillary 380 (*)    All other components within normal limits  CBC WITH DIFFERENTIAL/PLATELET - Abnormal; Notable for the following:    WBC 13.2 (*)    RBC 3.94 (*)    Hemoglobin 10.8 (*)    HCT 33.5 (*)    Neutrophils Relative % 89 (*)    Neutro Abs 11.8 (*)    Lymphocytes Relative 8 (*)    All other components within normal limits  COMPREHENSIVE METABOLIC PANEL - Abnormal; Notable for the following:    Glucose, Bld 296 (*)    BUN 66 (*)    Creatinine, Ser 2.63 (*)    Calcium 8.1 (*)    Total Protein 5.9 (*)    Albumin 2.2 (*)  ALT 8 (*)    GFR calc non Af Amer 20 (*)    GFR calc Af Amer 24 (*)    All other components within normal limits  MAGNESIUM - Abnormal; Notable for the following:    Magnesium 2.7 (*)    All other components within normal limits  GLUCOSE, CAPILLARY - Abnormal; Notable for the following:    Glucose-Capillary 300 (*)    All other components within normal limits  GLUCOSE, CAPILLARY - Abnormal; Notable for the following:    Glucose-Capillary 295 (*)    All other components within normal limits  GLUCOSE, CAPILLARY - Abnormal; Notable for the following:    Glucose-Capillary 266 (*)    All other components within normal limits  GLUCOSE, CAPILLARY - Abnormal; Notable for the following:    Glucose-Capillary 284 (*)    All other components within normal limits  GLUCOSE, CAPILLARY - Abnormal; Notable for the following:    Glucose-Capillary 434 (*)    All other components within normal limits  GLUCOSE, CAPILLARY - Abnormal; Notable for the following:    Glucose-Capillary 401 (*)    All other components within normal limits  GLUCOSE, CAPILLARY - Abnormal; Notable for the following:    Glucose-Capillary 405 (*)    All other components within normal limits  GLUCOSE, CAPILLARY - Abnormal; Notable for the following:    Glucose-Capillary 225 (*)    All other components within normal  limits  I-STAT TROPOININ, ED - Abnormal; Notable for the following:    Troponin i, poc 1.11 (*)    All other components within normal limits  I-STAT ARTERIAL BLOOD GAS, ED - Abnormal; Notable for the following:    pH, Arterial 7.342 (*)    pCO2 arterial 46.7 (*)    pO2, Arterial 150.0 (*)    Bicarbonate 25.3 (*)    All other components within normal limits  CULTURE, BLOOD (ROUTINE X 2)  CULTURE, BLOOD (ROUTINE X 2)  URINE CULTURE  MRSA PCR SCREENING  CULTURE, EXPECTORATED SPUTUM-ASSESSMENT  GRAM STAIN  PROTIME-INR  LACTIC ACID, PLASMA  PROCALCITONIN  MAGNESIUM  HIV ANTIBODY (ROUTINE TESTING)  LEGIONELLA ANTIGEN, URINE  STREP PNEUMONIAE URINARY ANTIGEN  INFLUENZA PANEL BY PCR (TYPE A & B, H1N1)  HEPARIN LEVEL (UNFRACTIONATED)  PROCALCITONIN  HEPARIN LEVEL (UNFRACTIONATED)  LACTIC ACID, PLASMA  HEPARIN LEVEL (UNFRACTIONATED)  SODIUM, URINE, RANDOM  CREATININE, URINE, RANDOM  CBC  CBC  PROCALCITONIN  HEPARIN LEVEL (UNFRACTIONATED)  CBC  CBC WITH DIFFERENTIAL/PLATELET  COMPREHENSIVE METABOLIC PANEL  MAGNESIUM  I-STAT CG4 LACTIC ACID, ED  I-STAT CG4 LACTIC ACID, ED    Imaging Review Dg Swallowing Func-speech Pathology  08/24/2014    Objective Swallowing Evaluation:    Patient Details  Name: Andrew Dunn MRN: [id number] Date of Birth: 1925-06-14  Today's Date: 08/24/2014 Time: SLP Start Time (ACUTE ONLY): 1030-SLP Stop Time (ACUTE ONLY): 1100 SLP Time Calculation (min) (ACUTE ONLY): 30 min  Past Medical History:  Past Medical History  Diagnosis Date  . Hypertension   . Diabetes mellitus   . Anxiety   . Renal disorder   . Parkinson disease   . Lumbar radiculopathy   . Osteopenia   . CHF (congestive heart failure)    Past Surgical History:  Past Surgical History  Procedure Laterality Date  . Rotator cuff repair    . Left toenail removed    . Cholecystectomy     HPI:  Other Pertinent Information: 81 y.o. male, resident of SNF,with history  of Parkinson's dementia, hypertension,  type 2 diabetes mellitus, chronic  kidney disease stage III, history of CHF presented to the ED with sudden  onset hypoxia/ARF, sepsis secondary to UTI/pna.  Some coughing noted with  meals, hence swallow eval ordered.    No Data Recorded  Assessment / Plan / Recommendation CHL IP CLINICAL IMPRESSIONS 08/24/2014  Therapy Diagnosis Mild pharyngeal phase dysphagia  Clinical Impression Pt presents with a mlld oropharyngeal dysphagia marked  primarily by prolonged oral preparation of mechanical consistencies, mild  swallow delay, mod pharyngeal residue.  When consuming thin liquids in  isolation, pt demonstrates adequate airway protection with intermittent  high penetration underlying the epiglottis.  When drinking thins in  conjunction with eating solids, the solids collect in the valleculae, and  the thins are more likely to penetrate the larynx to the vocal cords.   Penetration elicits a protective cough, which ejects material from larynx.   No aspiration was observed.  Recommend resuming a regular diet with thin  liquids; meds should be crushed and given in puree given risk of lodging  in valleculae.  Pt should alternate solids/liquids to maximize safety.   SLP will f/u for education and diet toleration.       CHL IP TREATMENT RECOMMENDATION 08/24/2014  Treatment Recommendations Therapy as outlined in treatment plan below     CHL IP DIET RECOMMENDATION 08/24/2014  SLP Diet Recommendations Age appropriate regular solids;Thin  Liquid Administration via (None)  Medication Administration Crushed with puree  Compensations Small sips/bites;Slow rate;Follow solids with liquid  Postural Changes and/or Swallow Maneuvers (None)     CHL IP OTHER RECOMMENDATIONS 08/24/2014  Recommended Consults (None)  Oral Care Recommendations Oral care BID  Other Recommendations (None)     No flowsheet data found.   CHL IP FREQUENCY AND DURATION 08/24/2014  Speech Therapy Frequency (ACUTE ONLY) min 3x week  Treatment Duration 1 week         SLP  Swallow Goals No flowsheet data found.  No flowsheet data found.    CHL IP REASON FOR REFERRAL 08/24/2014  Reason for Referral Objectively evaluate swallowing function     CHL IP ORAL PHASE 08/24/2014  Lips (None)  Tongue (None)  Mucous membranes (None)  Nutritional status (None)  Other (None)  Oxygen therapy (None)  Oral Phase Impaired  Oral - Pudding Teaspoon (None)  Oral - Pudding Cup (None)  Oral - Honey Teaspoon (None)  Oral - Honey Cup (None)  Oral - Honey Syringe (None)  Oral - Nectar Teaspoon (None)  Oral - Nectar Cup (None)  Oral - Nectar Straw (None)  Oral - Nectar Syringe (None)  Oral - Ice Chips (None)  Oral - Thin Teaspoon (None)  Oral - Thin Cup (None)  Oral - Thin Straw (None)  Oral - Thin Syringe (None)  Oral - Puree (None)  Oral - Mechanical Soft (None)  Oral - Regular (None)  Oral - Multi-consistency (None)  Oral - Pill (None)  Oral Phase - Comment (None)      CHL IP PHARYNGEAL PHASE 08/24/2014  Pharyngeal Phase Impaired  Pharyngeal - Pudding Teaspoon (None)  Penetration/Aspiration details (pudding teaspoon) (None)  Pharyngeal - Pudding Cup (None)  Penetration/Aspiration details (pudding cup) (None)  Pharyngeal - Honey Teaspoon (None)  Penetration/Aspiration details (honey teaspoon) (None)  Pharyngeal - Honey Cup (None)  Penetration/Aspiration details (honey cup) (None)  Pharyngeal - Honey Syringe (None)  Penetration/Aspiration details (honey syringe) (None)  Pharyngeal - Nectar Teaspoon (None)  Penetration/Aspiration details (nectar teaspoon) (None)  Pharyngeal - Nectar Cup (None)  Penetration/Aspiration details (nectar cup) (None)  Pharyngeal - Nectar Straw (None)  Penetration/Aspiration details (nectar straw) (None)  Pharyngeal - Nectar Syringe (None)  Penetration/Aspiration details (nectar syringe) (None)  Pharyngeal - Ice Chips (None)  Penetration/Aspiration details (ice chips) (None)  Pharyngeal - Thin Teaspoon (None)  Penetration/Aspiration details (thin teaspoon) (None)  Pharyngeal - Thin Cup  (None)  Penetration/Aspiration details (thin cup) (None)  Pharyngeal - Thin Straw (None)  Penetration/Aspiration details (thin straw) (None)  Pharyngeal - Thin Syringe (None)  Penetration/Aspiration details (thin syringe') (None)  Pharyngeal - Puree (None)  Penetration/Aspiration details (puree) (None)  Pharyngeal - Mechanical Soft (None)  Penetration/Aspiration details (mechanical soft) (None)  Pharyngeal - Regular (None)  Penetration/Aspiration details (regular) (None)  Pharyngeal - Multi-consistency (None)  Penetration/Aspiration details (multi-consistency) (None)  Pharyngeal - Pill (None)  Penetration/Aspiration details (pill) (None)  Pharyngeal Comment high penetration thin liquids - elicits protective  cough.  No aspiration noted.      No flowsheet data found.  No flowsheet data found.        Amanda L. Samson Frederic, [place] CCC/SLP Pager [phone]  Blenda Mounts Laurice 08/24/2014, 11:55 AM      EKG Interpretation   Date/Time:  Wednesday Aug 22 2014 14:33:05 EDT Ventricular Rate:  107 PR Interval:  168 QRS Duration: 108 QT Interval:  368 QTC Calculation: 491 R Axis:   -43 Text Interpretation:  Sinus tachycardia Multiform ventricular premature  complexes new Incomplete RBBB and LAFB Low voltage, precordial leads  Consider right ventricular hypertrophy Probable anterolateral infarct, old  Premature ventricular complexes New since previous tracing Reconfirmed by  JACUBOWITZ  MD, SAM [phone]) on 08/22/2014 3:15:44 PM      MDM   Final diagnoses:  SOB (shortness of breath)  Acute on chronic renal failure    81 yo M with PMH of CAD, CHF, DM, HTN, dementia, presenting from SNF with SOB.  History primarily from SNF 2/2 pt's dementia.  Nurse at SNF reports that pt has had increased LE edema over the past 2 weeks.  Unknown if he has missed doses of Lasix.  Yesterday, pt had temp of 100.2.  Today he was complaining of SOB.  Ate a sandwich for lunch, after which he was seen coughing and gagging.   Immediately after, he desaturated into low 80s.  EMS called, attempted to place on CPAP however pt unable to tolerate so placed on O2 via NRB with subsequent resolution of hypoxia.  Per SNF, pt also was having urinary retention today- foley was placed which pt pulled out- new one placed and hematuria noted following this.    On presentation, pt alert, confused, increased WOB.  Lungs with scattered wheezes and rhonchi.  Abdomen soft, non-tender.  3+ B/L LE edema.  Hematuria noted in foley bag.  No other acute findings. Possible aspiration or PNA vs CHF exacerbation vs UTI vs electrolyte abnormality.    CXR shows no acute findings, however may be lagging as aspiration event occurred just prior to presentation. Also likely UTI on U/a.  +leukocytosis to 16.  Lactate WNL. Antibiotics given. Troponin noted to be elevated to 1.1, however no acute EKG changes and pt denies chest pain- may be elevated 2/2 demand.  Pt admitted for further management. No other acute events during my care.  Discussed with attending Dr. Ethelda Chick.    Jodean Lima, MD 08/25/14 1700  Doug Sou, MD 08/27/14 [phone]

## 2014-08-22 NOTE — ED Provider Notes (Signed)
Level V caveat dementia. History is from nursing home notes. Patient with progressively worsening shortness of breath over the past 4-5 days. Patient denies pain anywhere presently. Treated by EMS with nonrebreather mask. On exam alert, fall some commands. Tachypnea. Lungs with extra wheezes and diffuse rhonchi  Doug Sou, MD 08/22/14 1523

## 2014-08-22 NOTE — ED Provider Notes (Signed)
MSE was initiated and I personally evaluated the patient and placed orders (if any) at  2:51 PM on Aug 22, 2014.  The patient appears stable so that the remainder of the MSE may be completed by another provider.  Patient from nursing home with change in mental status from baseline dementia. He has had increased peripheral edema, and shortness of breath. O2 sats were initially found in the low 80s. Patient would not tolerate CPAP, and a rise in nonrebreather with oxygen saturations above 90%. Patient is also diabetic. He has a chronic indwelling Foley catheter . It was changed just prior to arrival. Because the patient pulled it out . Patient had one episode of emesis today prior to arrival. There is concern for possible aspiration. He has diffuse wheezes and rhonchi. There is most audible over the left upper lobe on the anterior side. Initiated a sepsis work out without calling a code sepsis as it at this point, he does not meet criteria. Patient is currently stable for workup.  Arthor Captain, PA-C 08/22/14 1454  Gwyneth Sprout, MD 08/23/14 [phone]

## 2014-08-22 NOTE — ED Notes (Signed)
Attempted to obtain standing weight on pt. Pt too weak to stand.

## 2014-08-22 NOTE — ED Notes (Signed)
Patient with onset of sob today during lunch.  Patient has hx of dementia.  Patient has also had emesis today.  Concern for aspiration.  Initial sat were low in the 80's.  Patient placed on cpap with some improvement.  Patient did not tolerated.  He arrives with non rebreather in place.  He has received zofran 4mg  iv.  He has 20 g Iv to the left forearm.  Patient has noted swelling to bil lower extremities.  He resides at [facility].  Patient is also diabetic, cbg was 355.  Patient arrives with foley catheter.  Noted to have frank blood.  Facility reported that patient had pulled his prior cath out and they replaced this one today.  Patient states he has a little bit of groin pain.  Denies chest pain.  Patient does have DNR

## 2014-08-22 NOTE — Progress Notes (Signed)
ANTIBIOTIC CONSULT NOTE - INITIAL  Pharmacy Consult for Vancomycin and Cefepime Indication: rule out pneumonia  No Known Allergies  Patient Measurements: Height: 5' 6.93" (170 cm) Weight: 201 lb 1.6 oz (91.218 kg) IBW/kg (Calculated) : 65.94  Vital Signs: Temp: 99.4 F (37.4 C) (05/18 1855) Temp Source: Oral (05/18 1855) BP: 146/57 mmHg (05/18 1855) Pulse Rate: 108 (05/18 1855) Intake/Output from previous day:   Intake/Output from this shift:    Labs:  Recent Labs  08/22/14 1546  WBC 16.2*  HGB 11.3*  PLT 200  CREATININE 1.96*   Estimated Creatinine Clearance: 29.1 mL/min (by C-G formula based on Cr of 1.96). No results for input(s): VANCOTROUGH, VANCOPEAK, VANCORANDOM, GENTTROUGH, GENTPEAK, GENTRANDOM, TOBRATROUGH, TOBRAPEAK, TOBRARND, AMIKACINPEAK, AMIKACINTROU, AMIKACIN in the last 72 hours.   Microbiology: Recent Results (from the past 720 hour(s))  Blood Culture (routine x 2)     Status: None (Preliminary result)   Collection Time: 08/22/14  3:43 PM  Result Value Ref Range Status   Specimen Description BLOOD RIGHT FOREARM  Final   Special Requests BOTTLES DRAWN AEROBIC AND ANAEROBIC 5CC  Final   Culture PENDING  Incomplete   Report Status PENDING  Incomplete    Medical History: Past Medical History  Diagnosis Date  . Hypertension   . Diabetes mellitus   . Anxiety   . Renal disorder   . Parkinson disease   . Lumbar radiculopathy   . Osteopenia   . CHF (congestive heart failure)     Medications:  Scheduled:  . aspirin EC  81 mg Oral Daily  . carbidopa-levodopa  1 tablet Oral TID  . [START ON 08/23/2014] carvedilol  3.125 mg Oral BID WC  . ceFEPime (MAXIPIME) IV  1 g Intravenous 3 times per day  . donepezil  5 mg Oral QHS  . doxazosin  8 mg Oral QHS  . furosemide  40 mg Intravenous Q12H  . heparin  4,000 Units Intravenous Once  . [START ON 08/23/2014] insulin aspart  0-15 Units Subcutaneous TID WC  . insulin glargine  14 Units Subcutaneous QHS   . ipratropium  0.5 mg Nebulization Q6H  . levalbuterol  0.63 mg Nebulization Q6H  . mirtazapine  15 mg Oral QHS  . potassium chloride  40 mEq Oral Q4H  . [START ON 08/23/2014] potassium chloride  40 mEq Oral Daily  . [START ON 08/23/2014] sertraline  50 mg Oral Daily  . sodium chloride  3 mL Intravenous Q12H  . sodium polystyrene  15 g Oral Daily  . vancomycin  20 mg/kg (Order-Specific) Intravenous Once   Infusions:  . heparin     Assessment: 81 yo M presenting to ED on 08/22/2014 with worsening SOB. Pharmacy consulted to dose vancomycin and cefepime for possible PNA. Tmax 102.2, WBC elevated at 16.2, SCr elevated at 1.96 (CrCl ~28 ml/min). Blood, urine and sputum cultures sent.   Goal of Therapy:  Resolution of infection  Plan:  - Vanc 20 mg/kg x 1 given in ED, followed by 1000 mg IV q24h  - Cefepime 1 gm IV q24h - Monitor renal function, temp, WBC, C&S, vanc levels as indicated  Dietrich Samuelson K. Bonnye Fava, PharmD, BCPS Clinical Pharmacist - Resident Pager: [phone] Pharmacy: [phone] 08/22/2014 7:13 PM

## 2014-08-22 NOTE — ED Notes (Signed)
Spoke to xray about portable xray results per Dr. Sheppard Penton

## 2014-08-22 NOTE — ED Notes (Signed)
Elevated troponin of 1.11 reported to Dr. Shela Commons

## 2014-08-23 ENCOUNTER — Inpatient Hospital Stay (HOSPITAL_COMMUNITY): Payer: Medicare Other

## 2014-08-23 DIAGNOSIS — I219 Acute myocardial infarction, unspecified: Secondary | ICD-10-CM | POA: Diagnosis present

## 2014-08-23 DIAGNOSIS — J189 Pneumonia, unspecified organism: Secondary | ICD-10-CM

## 2014-08-23 DIAGNOSIS — I213 ST elevation (STEMI) myocardial infarction of unspecified site: Secondary | ICD-10-CM

## 2014-08-23 DIAGNOSIS — I480 Paroxysmal atrial fibrillation: Secondary | ICD-10-CM

## 2014-08-23 DIAGNOSIS — J690 Pneumonitis due to inhalation of food and vomit: Secondary | ICD-10-CM

## 2014-08-23 DIAGNOSIS — E1165 Type 2 diabetes mellitus with hyperglycemia: Secondary | ICD-10-CM

## 2014-08-23 DIAGNOSIS — R339 Retention of urine, unspecified: Secondary | ICD-10-CM

## 2014-08-23 DIAGNOSIS — I5021 Acute systolic (congestive) heart failure: Secondary | ICD-10-CM | POA: Diagnosis present

## 2014-08-23 DIAGNOSIS — I509 Heart failure, unspecified: Secondary | ICD-10-CM

## 2014-08-23 DIAGNOSIS — A419 Sepsis, unspecified organism: Secondary | ICD-10-CM | POA: Diagnosis present

## 2014-08-23 DIAGNOSIS — I220 Subsequent ST elevation (STEMI) myocardial infarction of anterior wall: Secondary | ICD-10-CM

## 2014-08-23 DIAGNOSIS — I4891 Unspecified atrial fibrillation: Secondary | ICD-10-CM | POA: Diagnosis present

## 2014-08-23 DIAGNOSIS — N179 Acute kidney failure, unspecified: Secondary | ICD-10-CM | POA: Diagnosis present

## 2014-08-23 DIAGNOSIS — N390 Urinary tract infection, site not specified: Secondary | ICD-10-CM

## 2014-08-23 DIAGNOSIS — E876 Hypokalemia: Secondary | ICD-10-CM

## 2014-08-23 DIAGNOSIS — N189 Chronic kidney disease, unspecified: Secondary | ICD-10-CM

## 2014-08-23 DIAGNOSIS — IMO0002 Reserved for concepts with insufficient information to code with codable children: Secondary | ICD-10-CM | POA: Diagnosis present

## 2014-08-23 LAB — TROPONIN I
Troponin I: 4.26 ng/mL (ref <0.031)
Troponin I: 4.44 ng/mL (ref <0.031)
Troponin I: 3.66 ng/mL (ref <0.031)

## 2014-08-23 LAB — COMPREHENSIVE METABOLIC PANEL
ALT: 5 U/L — ABNORMAL LOW (ref 17–63)
ANION GAP: 11 (ref 5–15)
AST: 27 U/L (ref 15–41)
Albumin: 2.1 g/dL — ABNORMAL LOW (ref 3.5–5.0)
Alkaline Phosphatase: 59 U/L (ref 38–126)
BUN: 46 mg/dL — ABNORMAL HIGH (ref 6–20)
CALCIUM: 7.7 mg/dL — AB (ref 8.9–10.3)
CO2: 25 mmol/L (ref 22–32)
Chloride: 104 mmol/L (ref 101–111)
Creatinine, Ser: 2.33 mg/dL — ABNORMAL HIGH (ref 0.61–1.24)
GFR calc Af Amer: 27 mL/min — ABNORMAL LOW (ref >60)
GFR calc non Af Amer: 24 mL/min — ABNORMAL LOW (ref >60)
GLUCOSE: 190 mg/dL — AB (ref 65–99)
Potassium: 3.6 mmol/L (ref 3.5–5.1)
SODIUM: 140 mmol/L (ref 135–145)
TOTAL PROTEIN: 5.3 g/dL — AB (ref 6.5–8.1)
Total Bilirubin: 0.6 mg/dL (ref 0.3–1.2)

## 2014-08-23 LAB — HEPARIN LEVEL (UNFRACTIONATED): Heparin Unfractionated: 0.36 IU/mL (ref 0.30–0.70)

## 2014-08-23 LAB — CBC
HEMATOCRIT: 31.3 % — AB (ref 39.0–52.0)
Hemoglobin: 10.2 g/dL — ABNORMAL LOW (ref 13.0–17.0)
MCH: 27.8 pg (ref 26.0–34.0)
MCHC: 32.6 g/dL (ref 30.0–36.0)
MCV: 85.3 fL (ref 78.0–100.0)
PLATELETS: 184 10*3/uL (ref 150–400)
RBC: 3.67 MIL/uL — ABNORMAL LOW (ref 4.22–5.81)
RDW: 14.1 % (ref 11.5–15.5)
WBC: 13.5 10*3/uL — ABNORMAL HIGH (ref 4.0–10.5)

## 2014-08-23 LAB — BASIC METABOLIC PANEL
Anion gap: 10 (ref 5–15)
BUN: 45 mg/dL — AB (ref 6–20)
CHLORIDE: 104 mmol/L (ref 101–111)
CO2: 25 mmol/L (ref 22–32)
CREATININE: 2.31 mg/dL — AB (ref 0.61–1.24)
Calcium: 8 mg/dL — ABNORMAL LOW (ref 8.9–10.3)
GFR calc Af Amer: 28 mL/min — ABNORMAL LOW (ref >60)
GFR calc non Af Amer: 24 mL/min — ABNORMAL LOW (ref >60)
Glucose, Bld: 190 mg/dL — ABNORMAL HIGH (ref 65–99)
Potassium: 3.6 mmol/L (ref 3.5–5.1)
Sodium: 139 mmol/L (ref 135–145)

## 2014-08-23 LAB — INFLUENZA PANEL BY PCR (TYPE A & B)
H1N1 flu by pcr: NOT DETECTED
INFLAPCR: NEGATIVE
INFLBPCR: NEGATIVE

## 2014-08-23 LAB — GLUCOSE, CAPILLARY
GLUCOSE-CAPILLARY: 194 mg/dL — AB (ref 65–99)
GLUCOSE-CAPILLARY: 176 mg/dL — AB (ref 65–99)
Glucose-Capillary: 180 mg/dL — ABNORMAL HIGH (ref 65–99)
Glucose-Capillary: 257 mg/dL — ABNORMAL HIGH (ref 65–99)

## 2014-08-23 LAB — MAGNESIUM: Magnesium: 2.6 mg/dL — ABNORMAL HIGH (ref 1.7–2.4)

## 2014-08-23 LAB — HIV ANTIBODY (ROUTINE TESTING W REFLEX): HIV SCREEN 4TH GENERATION: NONREACTIVE

## 2014-08-23 LAB — HEMOGLOBIN A1C
Hgb A1c MFr Bld: 8.3 % — ABNORMAL HIGH (ref 4.8–5.6)
Mean Plasma Glucose: 192 mg/dL

## 2014-08-23 LAB — STREP PNEUMONIAE URINARY ANTIGEN: Strep Pneumo Urinary Antigen: NEGATIVE

## 2014-08-23 MED ORDER — DILTIAZEM LOAD VIA INFUSION
5.0000 mg | Freq: Once | INTRAVENOUS | Status: AC
  Administered 2014-08-23: 5 mg via INTRAVENOUS
  Filled 2014-08-23: qty 5

## 2014-08-23 MED ORDER — METHYLPREDNISOLONE SODIUM SUCC 125 MG IJ SOLR
60.0000 mg | INTRAMUSCULAR | Status: DC
  Administered 2014-08-23 – 2014-08-24 (×2): 60 mg via INTRAVENOUS
  Filled 2014-08-23 (×2): qty 0.96
  Filled 2014-08-23: qty 2

## 2014-08-23 MED ORDER — INSULIN ASPART 100 UNIT/ML ~~LOC~~ SOLN
0.0000 [IU] | SUBCUTANEOUS | Status: DC
  Administered 2014-08-23: 11 [IU] via SUBCUTANEOUS
  Administered 2014-08-23 – 2014-08-24 (×3): 4 [IU] via SUBCUTANEOUS
  Administered 2014-08-24: 15 [IU] via SUBCUTANEOUS
  Administered 2014-08-24: 11 [IU] via SUBCUTANEOUS
  Administered 2014-08-24: 15 [IU] via SUBCUTANEOUS
  Administered 2014-08-24: 4 [IU] via SUBCUTANEOUS
  Administered 2014-08-25: 7 [IU] via SUBCUTANEOUS
  Administered 2014-08-25: 11 [IU] via SUBCUTANEOUS
  Administered 2014-08-25: 20 [IU] via SUBCUTANEOUS
  Administered 2014-08-25 – 2014-08-26 (×2): 7 [IU] via SUBCUTANEOUS
  Administered 2014-08-26: 4 [IU] via SUBCUTANEOUS
  Administered 2014-08-26: 3 [IU] via SUBCUTANEOUS
  Administered 2014-08-26 – 2014-08-28 (×7): 4 [IU] via SUBCUTANEOUS
  Administered 2014-08-28: 7 [IU] via SUBCUTANEOUS
  Administered 2014-08-28: 4 [IU] via SUBCUTANEOUS

## 2014-08-23 MED ORDER — HEPARIN BOLUS VIA INFUSION
2500.0000 [IU] | Freq: Once | INTRAVENOUS | Status: AC
  Administered 2014-08-23: 2500 [IU] via INTRAVENOUS
  Filled 2014-08-23: qty 2500

## 2014-08-23 MED ORDER — CETYLPYRIDINIUM CHLORIDE 0.05 % MT LIQD
7.0000 mL | Freq: Two times a day (BID) | OROMUCOSAL | Status: DC
  Administered 2014-08-23 – 2014-08-28 (×8): 7 mL via OROMUCOSAL

## 2014-08-23 MED ORDER — IPRATROPIUM-ALBUTEROL 0.5-2.5 (3) MG/3ML IN SOLN
3.0000 mL | Freq: Four times a day (QID) | RESPIRATORY_TRACT | Status: DC
  Administered 2014-08-23 – 2014-08-24 (×2): 3 mL via RESPIRATORY_TRACT
  Filled 2014-08-23 (×3): qty 3

## 2014-08-23 MED ORDER — DILTIAZEM HCL 100 MG IV SOLR
5.0000 mg/h | INTRAVENOUS | Status: DC
  Administered 2014-08-23 – 2014-08-25 (×4): 5 mg/h via INTRAVENOUS
  Filled 2014-08-23 (×2): qty 100

## 2014-08-23 MED ORDER — PERFLUTREN LIPID MICROSPHERE
1.0000 mL | INTRAVENOUS | Status: AC | PRN
  Administered 2014-08-23: 4 mL via INTRAVENOUS
  Filled 2014-08-23: qty 10

## 2014-08-23 NOTE — Progress Notes (Signed)
OT Cancellation Note  Patient Details Name: Andrew Dunn MRN: [id number] DOB: Sep 26, 1925   Cancelled Treatment:    Reason Eval/Treat Not Completed: Medical issues which prohibited therapy. MD and nursing in room, pt with elevated troponins. Will follow.  Evern Bio 08/23/2014, 10:44 AM

## 2014-08-23 NOTE — Progress Notes (Addendum)
Patient Name: Andrew Dunn Date of Encounter: 08/23/2014  Principal Problem:   Sepsis Active Problems:   Dementia   Diabetes mellitus   Hypokalemia   Parkinson's disease   Pernicious anemia   Osteopenia   Acute respiratory failure with hypoxia   Acute exacerbation of CHF (congestive heart failure)   HCAP (healthcare-associated pneumonia)   Elevated troponin   UTI (lower urinary tract infection)   Acute renal failure superimposed on stage 3 chronic kidney disease   Leukocytosis   Abnormal EKG   Urinary retention   Length of Stay: 1  SUBJECTIVE  The patient denies chest pain or SOB, the nurse states that he was confused at night.   CURRENT MEDS . antiseptic oral rinse  7 mL Mouth Rinse BID  . aspirin EC  81 mg Oral Daily  . carbidopa-levodopa  1 tablet Oral TID  . ceFEPime (MAXIPIME) IV  1 g Intravenous Q24H  . doxazosin  8 mg Oral QHS  . furosemide  40 mg Intravenous Q12H  . insulin aspart  0-15 Units Subcutaneous TID WC  . insulin glargine  14 Units Subcutaneous QHS  . metoprolol tartrate  12.5 mg Oral BID  . mirtazapine  15 mg Oral QHS  . potassium chloride  40 mEq Oral Daily  . sertraline  50 mg Oral Daily  . sodium chloride  3 mL Intravenous Q12H  . vancomycin  1,000 mg Intravenous Q24H   . heparin 1,050 Units/hr (08/22/14 1946)    OBJECTIVE  Filed Vitals:   08/23/14 0000 08/23/14 0400 08/23/14 0500 08/23/14 0737  BP: 105/35 114/43 100/47 124/28  Pulse: 80 78  81  Temp: 99.5 F (37.5 C)  99.3 F (37.4 C) 97.3 F (36.3 C)  TempSrc: Oral  Oral Axillary  Resp: 36 29 36 24  Height:      Weight:  199 lb (90.266 kg)    SpO2: 92%  95% 97%    Intake/Output Summary (Last 24 hours) at 08/23/14 0911 Last data filed at 08/23/14 0700  Gross per 24 hour  Intake    311 ml  Output    725 ml  Net   -414 ml   Filed Weights   08/22/14 1720 08/22/14 1855 08/23/14 0400  Weight: 191 lb (86.637 kg) 201 lb 1.6 oz (91.218 kg) 199 lb (90.266 kg)     PHYSICAL EXAM  General: Pleasant, NAD. Neuro: Alert and oriented X 3. Moves all extremities spontaneously. Psych: Normal affect. HEENT:  Normal  Neck: Supple without bruits or JVD. Lungs:  Resp regular and unlabored, CTA. Heart: RRR no s3, s4, or murmurs. Abdomen: Soft, non-tender, non-distended, BS + x 4.  Extremities: No clubbing, cyanosis or edema. DP/PT/Radials 2+ and equal bilaterally.  Accessory Clinical Findings  CBC  Recent Labs  08/22/14 1546  WBC 16.2*  HGB 11.3*  HCT 35.2*  MCV 85.9  PLT 200   Basic Metabolic Panel  Recent Labs  08/22/14 1546 08/22/14 1924  NA 138 139  K 3.0* 3.5  CL 101 99*  CO2 26 27  GLUCOSE 305* 295*  BUN 40* 41*  CREATININE 1.96* 2.14*  CALCIUM 7.9* 7.8*  MG  --  1.7   Liver Function Tests  Recent Labs  08/22/14 1924  AST 28  ALT 14*  ALKPHOS 65  BILITOT 1.0  PROT 6.1*  ALBUMIN 2.6*   Cardiac Enzymes  Recent Labs  08/22/14 1924  TROPONINI 2.74*    Recent Labs  08/22/14 1924  TSH 0.161*  Radiology/Studies  Dg Chest Port 1 View  08/23/2014   CLINICAL DATA:  Shortness of breath, elevated right hemidiaphragm.   IMPRESSION: Slight interval worsening in the appearance of the pulmonary interstitium suggesting low-grade edema secondary to CHF. There is right basilar atelectasis.     Dg Chest Port 1 View  08/22/2014   CLINICAL DATA:  Shortness of breath.  Diabetes.  IMPRESSION: 1. Mild enlargement of the cardiopericardial silhouette, without edema. 2. Stable elevation of the right hemidiaphragm, with mild atelectasis at the right lung base. 3. Atherosclerotic aortic arch.     TELE: SR to a-fib with RVR this am     ASSESSMENT AND PLAN  1. Acute respiratory failure most likely secondary to aspiration +/- CHF and Acute MI. He has diffuse inspiratory and expiratory wheezes on exam. No edema noted on chest xray although he does appear volume overloaded with elevated BNP and LE edema.   2. Sepsis most  likely secondary to UTI +/- PNA - per TRH  3. Elevated troponin, the patient developed STEMI in the anterior leads, that are new when compared to yesterday's ECG, bedside echo shows LVEF 30-25% with akinetis entire anterior, anteroseptal walls and apex.  There is contribution of the sepsis and new a-fib  With RVR worsening the demand ischemia.  Considering patient's underlying dementia, stage IV kidney function that is worsening we will treat conservatively. Metoprolol was added yesterday, continue ASA, heparin drip and atorvastatin.   4. Acute on chronic renal insufficiency stage III. We will hold diuretics.   5. Acute CHF with increased LE edema and elevated BNP. Continue IV diuretics and follow renal function closely. Check 2D echo to assess LVF  6. New onset a-fib with RVR - we will start low dose cardizem drip   Signed, Lars Masson MD, [facility] 08/23/2014

## 2014-08-23 NOTE — Progress Notes (Addendum)
ANTICOAGULATION CONSULT NOTE - Follow Up Consult  Pharmacy Consult for Heparin Indication: chest pain/ACS  No Known Allergies  Patient Measurements: Height: 5' 6.93" (170 cm) Weight: 199 lb (90.266 kg) IBW/kg (Calculated) : 65.94 Heparin Dosing Weight: 83.7 kg  Vital Signs: Temp: 97.3 F (36.3 C) (05/19 0737) Temp Source: Axillary (05/19 0737) BP: 130/58 mmHg (05/19 1035) Pulse Rate: 104 (05/19 1035)  Labs:  Recent Labs  08/22/14 1546 08/22/14 1924 08/23/14 0909  HGB 11.3*  --  10.2*  HCT 35.2*  --  31.3*  PLT 200  --  184  LABPROT 14.7  --   --   INR 1.13  --   --   HEPARINUNFRC  --   --  <0.10*  CREATININE 1.96* 2.14* 2.31*  TROPONINI  --  2.74* 4.26*    Estimated Creatinine Clearance: 24.6 mL/min (by C-G formula based on Cr of 2.31).   Medications:  Infusions:  . diltiazem (CARDIZEM) infusion    . heparin 1,050 Units/hr (08/22/14 1946)    Assessment: 81 year old male on IV heparin for ACS. Troponin peaked to 4.26.   Initial HL at 9AM undetectable on 1050 units/hr.  No issues with infusion per RN.   Goal of Therapy:  Heparin level 0.3-0.7 units/ml Monitor platelets by anticoagulation protocol: Yes  Plan:  Re-bolus heparin 2500 units x1, then increase heparin to 1350 units/hr.  Repeat heparin level in 8 hours.  Daily heparin level and CBC while on therapy.   Link Snuffer, PharmD, BCPS Clinical Pharmacist [phone] 08/23/2014,11:19 AM  Addendum: Repeat heparin level is therapeutic at 0.36. Continue heparin at 1350 units/hr and follow up AM labs.  Louie Casa, PharmD, BCPS 08/23/2014, 8:03 PM

## 2014-08-23 NOTE — Evaluation (Signed)
Clinical/Bedside Swallow Evaluation Patient Details  Name: Andrew Dunn MRN: [id number] Date of Birth: December 11, 1925  Today's Date: 08/23/2014 Time: SLP Start Time (ACUTE ONLY): 1115 SLP Stop Time (ACUTE ONLY): 1135 SLP Time Calculation (min) (ACUTE ONLY): 20 min  Past Medical History:  Past Medical History  Diagnosis Date  . Hypertension   . Diabetes mellitus   . Anxiety   . Renal disorder   . Parkinson disease   . Lumbar radiculopathy   . Osteopenia   . CHF (congestive heart failure)    Past Surgical History:  Past Surgical History  Procedure Laterality Date  . Rotator cuff repair    . Left toenail removed    . Cholecystectomy     HPI:  82 y.o. male, resident of SNF,with history of Parkinson's dementia, hypertension, type 2 diabetes mellitus, chronic kidney disease stage III, history of CHF presented to the ED with sudden onset hypoxia/ARF, sepsis secondary to UTI/pna.  Some coughing noted with meals, hence swallow eval ordered.     Assessment / Plan / Recommendation Clinical Impression  Pt presents with clinical signs of potential aspiration, with overt coughing associated with consumption of thin liquids.  Given hx of Parkinson's, dementia, and respiratory issues, pt may benefit from [facility] to eludicate source of difficulty.  No family present - recommend downgrading diet to dysphagia 3, nectar-thick liquids until MBS can be completed next date.  D/W RN.     Aspiration Risk  Mild    Diet Recommendation Dysphagia 3 (Mech soft);Nectar   Medication Administration: Whole meds with puree Compensations: Small sips/bites;Slow rate    Other  Recommendations Oral Care Recommendations: Oral care BID   Follow Up Recommendations       Frequency and Duration min 3x week  2 weeks       SLP Swallow Goals   Pending MBS next date  Swallow Study Prior Functional Status       General Date of Onset: 08/22/14 Other Pertinent Information: 81 y.o. male, resident of SNF,with  history of Parkinson's dementia, hypertension, type 2 diabetes mellitus, chronic kidney disease stage III, history of CHF presented to the ED with sudden onset hypoxia/ARF, sepsis secondary to UTI/pna.  Some coughing noted with meals, hence swallow eval ordered.   Type of Study: Bedside swallow evaluation Previous Swallow Assessment: none per records Diet Prior to this Study: Thin liquids Temperature Spikes Noted: Yes Respiratory Status: Supplemental O2 delivered via (comment) Behavior/Cognition: Alert;Cooperative;Confused Oral Cavity - Dentition: Adequate natural dentition/normal for age Self-Feeding Abilities: Able to feed self Patient Positioning: Upright in bed Baseline Vocal Quality: Normal Volitional Cough: Strong Volitional Swallow: Able to elicit    Oral/Motor/Sensory Function Overall Oral Motor/Sensory Function: Appears within functional limits for tasks assessed   Ice Chips Ice chips: Within functional limits   Thin Liquid Thin Liquid: Impaired Presentation: Self Fed;Straw Pharyngeal  Phase Impairments: Cough - Immediate    Nectar Thick Nectar Thick Liquid: Within functional limits Presentation: Cup   Honey Thick Honey Thick Liquid: Not tested   Puree Puree: Within functional limits   Solid  Malavika Lira L. [place], [place] CCC/SLP Pager [phone]     Solid: Within functional limits       Blenda Mounts Laurice 08/23/2014,11:47 AM

## 2014-08-23 NOTE — Progress Notes (Signed)
MD notified patient refused cardiac enzymes. Adria Dill RN

## 2014-08-23 NOTE — Progress Notes (Signed)
Pt admitted from [facility], CSW following.

## 2014-08-23 NOTE — Progress Notes (Addendum)
[place] TEAM 1 - Stepdown/ICU TEAM Progress Note  Andrew Dunn:[id number] DOB: 27-Mar-1926 DOA: 08/22/2014 PCP: Verneita Griffes, MD  Admit HPI / Brief Narrative: 81 y.o. WM PMHx Parkinson's dementia, hypertension, type 2 diabetes mellitus, chronic kidney disease stage III, history of CHF, remote tobacco history quit in 2009, [facility], Presents to the ED with sudden onset hypoxia with some shortness of breath, coughing, wheezing and congestion. Patient with a history of dementia and a such that history was obtained from patient's daughter as well as per ED physician. Per daughter over the past weekend patient has had worsening lower extremity edema with urinary retention and a Foley catheter was placed. Patient's daughter stated that the night prior to admission patient had a low-grade temp of 100.2. Patient denied any chest pain no shortness of breath no abdominal pain no dysuria. Patient's daughter states patient has had decreased oral intake with generalized weakness. He was also noted that patient pulled out his Foley catheter on the day of admission. On the day of admission patient ate his lunch only ate a small bite and drank a little bit of his drink. Patient was subsequently noted later on to have some coughing with some wheezing and some congestion noted by nursing that facility. Patient also noted with emesis. Patient was seen in the emergency room and noted to have a temp of 102.2. Patient was noted to be tachycardic heart rate as high as 113 and tachypnea With a respiratory rate as high as 37. ABG which was obtained had a pH of 7.34, PCO2 of 47, PO2 of 150. CBC obtained had a white count of 16.2 hemoglobin of 11.3 otherwise was within normal limits. Basic metabolic profile had a potassium of 3.0 BUN of 40 creatinine of 1.96 otherwise was within normal limits. BNP was elevated at 772.4. First set of point-of-care troponin was elevated at 1.11. Lactic acid  level is 1.47. Chest x-ray showed mild enlargement of cardiopericardial silhouette without edema. Stable elevation of right hemidiaphragm with mild atelectasis at the right lung base. Atherosclerotic aortic arch. Urinalysis done was cloudy many bacteria large leukocytes, negative nitrite, greater than 300 protein, too numerous to count RBCs, too numerous to count WBCs. Patient was given a dose of IV antibiotics. [facility] were called to admit the patient for further evaluation and management.  HPI/Subjective: 5/19 A/O 1 (does not know where, when, why), states negative CP, negative SOB. States negative home O2.  Assessment/Plan: sepsis secondary to UTI/HCAP/Aspiration pneumonia -Likely secondary to urinary tract infection/ HCAP? -Continue current antibiotics -DuoNeb QID -Flutter valve -Solu-Medrol 60 mg daily  Acute respiratory failure with hypoxia -Titrate O2 to maintain SPO2> 93%  urinary tract infection -Positive GNR; current antibiotics should cover most organisms await speciation and sensitivities  Leukocytosis -See sepsis  STEMI -Cardiology aware but secondary to patient's multiple comorbidities medical management only -See echocardiogram below  -Trend troponins  -Continue heparin drip  -Continue aspirin 81 mg daily  -TSH slightly low, however free T4 within normal limit  -Strict in and out since admission; - , attempt to maintain euvolemic  A. fib RVR -Patient just received a.m. metoprolol if this does not bring A. fib under control will start patient on amiodarone drip.  Acute systolic CHF  See STEMI  elevated troponin/abnormal EKG -See STEMI -Cardiology consult; await recommendations  acute on chronic renal failure (Baseline creatinine 1.3-1.5). - Likely secondary to a prerenal azotemia secondary to acute CHF exacerbation.  -Check a fractional excretion of sodium.  Check a renal ultrasound.  -Lasix PRN  Hypokalemia -Potassium goal> 4 -Secondary to  diuretics. Check a magnesium level. Replete.  Diabetes mellitus Type 2uncontrolled -5/18 Hemoglobin A1c = 8.3  -Continue Lantus 14 units daily -Increase to resistant SSI  Parkinson's disease -Sinemet 25-250 mg TID .  urinary retention -Maintain Foley catheter. -Doxazosin  QHS      Code Status: FULL Family Communication: no family present at time of exam Disposition Plan: Resolution sepsis    Consultants: Dr.Katarina San Morelle, (cardiology)    Procedure/Significant Events: 5/19 echocardiogram;- LVEF is approximately 35% with hypokinesis prox/mid inferior wall; akiensis of the distal inferior, distal anterior, distal septal and apex. -mild LVH. -Pulmonary arteries: PA peak pressure: 43 mm Hg (S).   Culture 5/18 urine positive GNR 5/18 blood right forearm 2 NGTD 5/18 MRSA by PCR negative    Antibiotics: Cefepime 5/18>> Vancomycin 5/18>>   DVT prophylaxis: Heparin drip    Devices   LINES / TUBES:      Continuous Infusions: . diltiazem (CARDIZEM) infusion 5 mg/hr (08/23/14 1208)  . heparin 1,350 Units/hr (08/23/14 1614)    Objective: VITAL SIGNS: Temp: 98.3 F (36.8 C) (05/19 1400) Temp Source: Oral (05/19 1400) BP: 131/76 mmHg (05/19 1400) Pulse Rate: 104 (05/19 1400) SPO2; FIO2:   Intake/Output Summary (Last 24 hours) at 08/23/14 2030 Last data filed at 08/23/14 1630  Gross per 24 hour  Intake    261 ml  Output    925 ml  Net   -664 ml     Exam: General: A/O 1 (does not know where, when, why), follows all commands, No acute respiratory distress Lungs: Clear to auscultation bilaterally without wheezes or crackles Cardiovascular: Irregular irregular rhythm and rate, negative murmur gallop or rub normal S1 and S2 Abdomen: Nontender, nondistended, soft, bowel sounds positive, no rebound, no ascites, no appreciable mass Extremities: No significant cyanosis, clubbing. Bilateral lower extremity edema 2-3+ pitting to mid thigh  Data  Reviewed: Basic Metabolic Panel:  Recent Labs Lab 08/22/14 1546 08/22/14 1924 08/23/14 0909 08/23/14 1207  NA 138 139 139 140  K 3.0* 3.5 3.6 3.6  CL 101 99* 104 104  CO2 GLUCOSE 305* 295* 190* 190*  BUN 40* 41* 45* 46*  CREATININE 1.96* 2.14* 2.31* 2.33*  CALCIUM 7.9* 7.8* 8.0* 7.7*  MG  --  1.7  --  2.6*   Liver Function Tests:  Recent Labs Lab 08/22/14 1924 08/23/14 1207  AST 28 27  ALT 14* 5*  ALKPHOS 65 59  BILITOT 1.0 0.6  PROT 6.1* 5.3*  ALBUMIN 2.6* 2.1*   No results for input(s): LIPASE, AMYLASE in the last 168 hours. No results for input(s): AMMONIA in the last 168 hours. CBC:  Recent Labs Lab 08/22/14 1546 08/23/14 0909  WBC 16.2* 13.5*  HGB 11.3* 10.2*  HCT 35.2* 31.3*  MCV 85.9 85.3  PLT 200 184   Cardiac Enzymes:  Recent Labs Lab 08/22/14 1924 08/23/14 0909 08/23/14 1207  TROPONINI 2.74* 4.26* 4.44*   BNP (last 3 results)  Recent Labs  08/22/14 1546  BNP 772.4*    ProBNP (last 3 results) No results for input(s): PROBNP in the last 8760 hours.  CBG:  Recent Labs Lab 08/22/14 1859 08/22/14 2204 08/23/14 0803 08/23/14 1159 08/23/14 1707  GLUCAP 265* 360* 194* 180* 176*    Recent Results (from the past 240 hour(s))  Urine culture     Status: None (Preliminary result)   Collection Time: 08/22/14  3:28  PM  Result Value Ref Range Status   Specimen Description URINE, CATHETERIZED  Final   Special Requests NONE  Final   Colony Count   Final    >=100,000 COLONIES/ML Performed at [facility]    Culture   Final    GRAM NEGATIVE RODS Performed at [facility]    Report Status PENDING  Incomplete  Blood Culture (routine x 2)     Status: None (Preliminary result)   Collection Time: 08/22/14  3:43 PM  Result Value Ref Range Status   Specimen Description BLOOD RIGHT FOREARM  Final   Special Requests BOTTLES DRAWN AEROBIC AND ANAEROBIC 5CC  Final   Culture   Final           BLOOD CULTURE  RECEIVED NO GROWTH TO DATE CULTURE WILL BE HELD FOR 5 DAYS BEFORE ISSUING A FINAL NEGATIVE REPORT Performed at [facility]    Report Status PENDING  Incomplete  MRSA PCR Screening     Status: None   Collection Time: 08/22/14  6:56 PM  Result Value Ref Range Status   MRSA by PCR NEGATIVE NEGATIVE Final    Comment:        The GeneXpert MRSA Assay (FDA approved for NASAL specimens only), is one component of a comprehensive MRSA colonization surveillance program. It is not intended to diagnose MRSA infection nor to guide or monitor treatment for MRSA infections.      Studies:  Recent x-ray studies have been reviewed in detail by the Attending Physician  Scheduled Meds:  Scheduled Meds: . antiseptic oral rinse  7 mL Mouth Rinse BID  . aspirin EC  81 mg Oral Daily  . carbidopa-levodopa  1 tablet Oral TID  . ceFEPime (MAXIPIME) IV  1 g Intravenous Q24H  . doxazosin  8 mg Oral QHS  . insulin aspart  0-20 Units Subcutaneous 6 times per day  . insulin glargine  14 Units Subcutaneous QHS  . metoprolol tartrate  12.5 mg Oral BID  . mirtazapine  15 mg Oral QHS  . potassium chloride  40 mEq Oral Daily  . sertraline  50 mg Oral Daily  . sodium chloride  3 mL Intravenous Q12H  . vancomycin  1,000 mg Intravenous Q24H    Time spent on care of this patient: 40 mins   Landy Mace, Roselind Messier , MD  [facility] Office  [phone] Pager - [phone]  On-Call/Text Page:      [email or web address]      password [email or web address]  If 7PM-7AM, please contact night-coverage [email or web address] Password [email or web address] 08/23/2014, 8:30 PM   LOS: 1 day   Care during the described time interval was provided by me .  I have reviewed this patient's available data, including medical history, events of note, physical examination, radiology studies and test results as part of my evaluation  Carolyne Littles, MD [phone] Pager

## 2014-08-23 NOTE — Progress Notes (Signed)
PT Cancellation Note  Patient Details Name: Andrew Dunn MRN: [id number] DOB: April 19, 1925   Cancelled Treatment:    Reason Eval/Treat Not Completed: Medical issues which prohibited therapy.  Patient with elevated troponin.  MD/RN with patient.  Will hold PT today and return tomorrow for PT evaluation as appropriate.   [name] 08/23/2014, 12:11 PM [name]. Renaldo Fiddler, [facility] Acute Rehab Services Pager [phone]

## 2014-08-23 NOTE — Progress Notes (Signed)
Echocardiogram 2D Echocardiogram with Definity has been performed.  Nolon Rod 08/23/2014, 11:09 AM

## 2014-08-23 NOTE — Progress Notes (Signed)
Pt refused labs this am and lab called to make another attempt to collect blood specimens.

## 2014-08-24 ENCOUNTER — Inpatient Hospital Stay (HOSPITAL_COMMUNITY): Payer: Medicare Other

## 2014-08-24 DIAGNOSIS — F0390 Unspecified dementia without behavioral disturbance: Secondary | ICD-10-CM

## 2014-08-24 LAB — URINE CULTURE

## 2014-08-24 LAB — COMPREHENSIVE METABOLIC PANEL
ALBUMIN: 2.3 g/dL — AB (ref 3.5–5.0)
ALK PHOS: 66 U/L (ref 38–126)
ALT: 6 U/L — ABNORMAL LOW (ref 17–63)
AST: 30 U/L (ref 15–41)
Anion gap: 8 (ref 5–15)
BILIRUBIN TOTAL: 0.4 mg/dL (ref 0.3–1.2)
BUN: 50 mg/dL — AB (ref 6–20)
CHLORIDE: 104 mmol/L (ref 101–111)
CO2: 26 mmol/L (ref 22–32)
CREATININE: 2.73 mg/dL — AB (ref 0.61–1.24)
Calcium: 8.3 mg/dL — ABNORMAL LOW (ref 8.9–10.3)
GFR calc Af Amer: 23 mL/min — ABNORMAL LOW (ref >60)
GFR calc non Af Amer: 20 mL/min — ABNORMAL LOW (ref >60)
GLUCOSE: 152 mg/dL — AB (ref 65–99)
Potassium: 3.9 mmol/L (ref 3.5–5.1)
Sodium: 138 mmol/L (ref 135–145)
Total Protein: 6.1 g/dL — ABNORMAL LOW (ref 6.5–8.1)

## 2014-08-24 LAB — CBC WITH DIFFERENTIAL/PLATELET
Basophils Absolute: 0 10*3/uL (ref 0.0–0.1)
Basophils Relative: 0 % (ref 0–1)
Eosinophils Absolute: 0.1 10*3/uL (ref 0.0–0.7)
Eosinophils Relative: 1 % (ref 0–5)
HCT: 32.6 % — ABNORMAL LOW (ref 39.0–52.0)
Hemoglobin: 10.4 g/dL — ABNORMAL LOW (ref 13.0–17.0)
LYMPHS ABS: 0.9 10*3/uL (ref 0.7–4.0)
LYMPHS PCT: 7 % — AB (ref 12–46)
MCH: 27.2 pg (ref 26.0–34.0)
MCHC: 31.9 g/dL (ref 30.0–36.0)
MCV: 85.3 fL (ref 78.0–100.0)
MONO ABS: 0.3 10*3/uL (ref 0.1–1.0)
MONOS PCT: 2 % — AB (ref 3–12)
NEUTROS PCT: 90 % — AB (ref 43–77)
Neutro Abs: 11.2 10*3/uL — ABNORMAL HIGH (ref 1.7–7.7)
Platelets: 208 10*3/uL (ref 150–400)
RBC: 3.82 MIL/uL — ABNORMAL LOW (ref 4.22–5.81)
RDW: 14.3 % (ref 11.5–15.5)
WBC: 12.5 10*3/uL — ABNORMAL HIGH (ref 4.0–10.5)

## 2014-08-24 LAB — GLUCOSE, CAPILLARY
GLUCOSE-CAPILLARY: 320 mg/dL — AB (ref 65–99)
GLUCOSE-CAPILLARY: 380 mg/dL — AB (ref 65–99)
Glucose-Capillary: 154 mg/dL — ABNORMAL HIGH (ref 65–99)
Glucose-Capillary: 195 mg/dL — ABNORMAL HIGH (ref 65–99)
Glucose-Capillary: 300 mg/dL — ABNORMAL HIGH (ref 65–99)

## 2014-08-24 LAB — MAGNESIUM: Magnesium: 2.5 mg/dL — ABNORMAL HIGH (ref 1.7–2.4)

## 2014-08-24 LAB — LACTIC ACID, PLASMA: LACTIC ACID, VENOUS: 0.9 mmol/L (ref 0.5–2.0)

## 2014-08-24 LAB — HEPARIN LEVEL (UNFRACTIONATED): Heparin Unfractionated: 0.44 IU/mL (ref 0.30–0.70)

## 2014-08-24 LAB — LEGIONELLA ANTIGEN, URINE

## 2014-08-24 LAB — TROPONIN I: Troponin I: 3.27 ng/mL (ref <0.031)

## 2014-08-24 LAB — PROCALCITONIN: Procalcitonin: 20.09 ng/mL

## 2014-08-24 MED ORDER — FUROSEMIDE 10 MG/ML IJ SOLN
40.0000 mg | Freq: Once | INTRAMUSCULAR | Status: AC
  Administered 2014-08-24: 40 mg via INTRAVENOUS

## 2014-08-24 MED ORDER — VANCOMYCIN HCL IN DEXTROSE 1-5 GM/200ML-% IV SOLN
1000.0000 mg | INTRAVENOUS | Status: DC
  Filled 2014-08-24: qty 200

## 2014-08-24 MED ORDER — INSULIN GLARGINE 100 UNIT/ML ~~LOC~~ SOLN
20.0000 [IU] | Freq: Every day | SUBCUTANEOUS | Status: DC
  Administered 2014-08-24 – 2014-08-27 (×4): 20 [IU] via SUBCUTANEOUS
  Filled 2014-08-24 (×5): qty 0.2

## 2014-08-24 NOTE — Evaluation (Signed)
Physical Therapy Evaluation Patient Details Name: Andrew Dunn MRN: [id number] DOB: 02-Dec-1925 Today's Date: 08/24/2014   History of Present Illness  81 y.o. WM PMHx Parkinson's dementia, hypertension, type 2 diabetes mellitus, chronic kidney disease stage III, history of CHF, remote tobacco history quit in 2009, [facility].  Clinical Impression  Pt admitted with above diagnosis. Pt currently with functional limitations due to the deficits listed below (see PT Problem List).Pt not very participatory today.  Refused EOB. Would only roll and move UE and LEs to command.  Will need NHP at d/c.   Pt will benefit from skilled PT to increase their independence and safety with mobility to allow discharge to the venue listed below.      Follow Up Recommendations SNF;Supervision/Assistance - 24 hour    Equipment Recommendations  Other (comment) (TBA)    Recommendations for Other Services       Precautions / Restrictions Precautions Precautions: Fall Restrictions Weight Bearing Restrictions: No      Mobility  Bed Mobility Overal bed mobility: Needs Assistance;+2 for physical assistance Bed Mobility: Rolling Rolling: Max assist;+2 for physical assistance         General bed mobility comments: Pt refused to do mor than roll.   Transfers                    Ambulation/Gait                Stairs            Wheelchair Mobility    Modified Rankin (Stroke Patients Only)       Balance                                             Pertinent Vitals/Pain Pain Assessment: No/denies pain  VSS    Home Living Family/patient expects to be discharged to:: Skilled nursing facility                      Prior Function Level of Independence: Needs assistance   Gait / Transfers Assistance Needed: 1 person assist to transfer to chair, states he walks some and uses wheelchair rest of time as primary means of  transport.  ADL's / Homemaking Assistance Needed: dependent  Comments: Pt unreliable historian.     Hand Dominance        Extremity/Trunk Assessment   Upper Extremity Assessment: Defer to OT evaluation           Lower Extremity Assessment: RLE deficits/detail;LLE deficits/detail RLE Deficits / Details: appears grossly 2+/5 LLE Deficits / Details: appears grossly 2+/5     Communication   Communication: No difficulties  Cognition Arousal/Alertness: Lethargic Behavior During Therapy: Flat affect Overall Cognitive Status: Difficult to assess                      General Comments      Exercises General Exercises - Lower Extremity Heel Slides: AROM;Both;5 reps;Supine Hip ABduction/ADduction: AROM;Both;5 reps;Supine      Assessment/Plan    PT Assessment Patient needs continued PT services  PT Diagnosis Generalized weakness   PT Problem List Decreased activity tolerance;Decreased balance;Decreased mobility;Decreased strength;Decreased cognition;Decreased knowledge of use of DME;Decreased safety awareness;Decreased knowledge of precautions  PT Treatment Interventions DME instruction;Gait training;Functional mobility training;Therapeutic activities;Therapeutic exercise;Balance training;Patient/family education   PT Goals (Current goals  can be found in the Care Plan section) Acute Rehab PT Goals Patient Stated Goal: unable to state PT Goal Formulation: With patient Time For Goal Achievement: 09/07/14 Potential to Achieve Goals: Fair    Frequency Min 2X/week   Barriers to discharge Decreased caregiver support      Co-evaluation               End of Session Equipment Utilized During Treatment: Gait belt Activity Tolerance: Patient limited by fatigue;Patient limited by lethargy Patient left: in bed;with call bell/phone within reach;with bed alarm set Nurse Communication: Mobility status;Need for lift equipment         Time: 1210-1220 PT Time  Calculation (min) (ACUTE ONLY): 10 min   Charges:   PT Evaluation $Initial PT Evaluation Tier I: 1 Procedure     PT G CodesBerline Lopes 09/15/14, 2:58 PM Nolyn Swab [facility] Acute Rehabilitation [phone] (pager)

## 2014-08-24 NOTE — Progress Notes (Signed)
ANTICOAGULATION & ANTIBIOTIC CONSULT NOTE - Follow Up Consult  Pharmacy Consult for Heparin; vancomycin, cefepime Indication: chest pain/ACS and atrial fibrillation; UTI and rule out pneumonia  No Known Allergies  Patient Measurements: Height: 5' 6.93" (170 cm) Weight: 200 lb 14.4 oz (91.128 kg) (bed weight) IBW/kg (Calculated) : 65.94 Heparin Dosing Weight: 83.7 kg  Vital Signs: Temp: 98.2 F (36.8 C) (05/20 0400) Temp Source: Oral (05/20 0400) BP: 120/42 mmHg (05/20 0900) Pulse Rate: 73 (05/20 0800)  Labs:  Recent Labs  08/22/14 1546  08/23/14 0909 08/23/14 1207 08/23/14 1929 08/24/14 0112 08/24/14 0117  HGB 11.3*  --  10.2*  --   --  10.4*  --   HCT 35.2*  --  31.3*  --   --  32.6*  --   PLT 200  --  184  --   --  208  --   LABPROT 14.7  --   --   --   --   --   --   INR 1.13  --   --   --   --   --   --   HEPARINUNFRC  --   --  <0.10*  --  0.36  --  0.44  CREATININE 1.96*  < > 2.31* 2.33*  --  2.73*  --   TROPONINI  --   < > 4.26* 4.44* 3.66*  --  3.27*  < > = values in this interval not displayed.  Estimated Creatinine Clearance: 20.9 mL/min (by C-G formula based on Cr of 2.73).   Medications:  Infusions:  . diltiazem (CARDIZEM) infusion 5 mg/hr (08/24/14 0700)  . heparin 1,350 Units/hr (08/24/14 0700)    Assessment: 81 year old male on IV heparin for acute MI and new Afib. Heparin level is therapeutic on 1350 units/hr. CBC is low-stable. No bleeding reported.   Patient is on day#3 of vancomycin and cefepime for rule out PNA and + UTI. Patient is currently afebrile with WBC down to 12.5. PCT is rising at 20.09, but lactic acid is down to 0.9. Patient has acute on chronic CKDIII with rising SCr at 2.73 and decrease urine output at 0.2 cc/kg/hr - only 475 cc out over last 24 hours.   Vanc 5/18>>  Cefepime 5/18>>  Zosyn 5/18 x1 dose ED.   5/18 UCx>> 100K GNR  5/18 BCx2>>  5/18 sputum cx - no result  Goal of Therapy:  Heparin level 0.3-0.7  units/ml Monitor platelets by anticoagulation protocol: Yes  Vancomycin trough 15-20 Clinical resolution of infection  Plan:  Continue heparin at 1350 units/hr.  Daily heparin level and CBC while on therapy.  Decrease vancomycin to 1000 mg IV every 48 hours due to worsening renal function. Continue cefepime at reduced dose of 1000 mg IV every 24 hours.  *Consider lasix trial in setting of CHF as patient on at home and UOP decreasing.   Link Snuffer, PharmD, BCPS Clinical Pharmacist [phone] 08/24/2014,10:14 AM

## 2014-08-24 NOTE — Clinical Social Work Note (Signed)
Clinical Social Work Assessment  Patient Details  Name: Andrew Dunn MRN: [id number] Date of Birth: 1925/10/12  Date of referral:  08/24/14               Reason for consult:  Facility Placement                Permission sought to share information with:  Family Supports Permission granted to share information::  Yes, Verbal Permission Granted  Name::     Jasmine December patient's daughter  Agency::  [facility] SNF where he is a long term care resident.  Relationship::     Contact Information:     Housing/Transportation Living arrangements for the past 2 months:  Skilled Nursing Facility Source of Information:  Adult Children Patient Interpreter Needed:  None Criminal Activity/Legal Involvement Pertinent to Current Situation/Hospitalization:  No - Comment as needed Significant Relationships:  Adult Children Lives with:  Facility Resident Do you feel safe going back to the place where you live?  Yes Need for family participation in patient care:  Yes (Comment) (Patient has dementia)  Care giving concerns: Patient is a long term care resident at [facility], and patient and family are pleased with care.   Social Worker assessment / plan:  Patient is a 81 year old male who is a resident at [facility].  Patient has been living there for about 3 years now.  Patient is alert and oriented x1 aware of person.  Patient has dementia, and did not feel like responding to CSW, so CSW spoke with patient's daughter.  CSW explained to patient, the role of this CSW.  CSW informed patient and her daughter that CSW will continue to follow and facility discharge placement as he progresses.  Patient and family did not have any other questions.  CSW informed patient and family that facility can do PT with him once he returns.  Employment status:  Retired Health and safety inspector:  Medicare PT Recommendations:  Skilled Nursing Facility Information / Referral to community resources:      Patient/Family's Response to care: Patient and his daughter are in agreement to returning to SNF.  Patient/Family's Understanding of and Emotional Response to Diagnosis, Current Treatment, and Prognosis:  Patient awake, but tired, patient daughter aware of patient diagnosis and current treatment plan.  Emotional Assessment Appearance:  Appears stated age Attitude/Demeanor/Rapport:  Lethargic Affect (typically observed):  Pleasant, Quiet Orientation:  Oriented to Self Alcohol / Substance use:  Not Applicable Psych involvement (Current and /or in the community):  No (Comment)  Discharge Needs  Concerns to be addressed:  No discharge needs identified Readmission within the last 30 days:  No Current discharge risk:  None Barriers to Discharge:  No Barriers Identified   [place] Constable 08/24/2014, 4:33 PM

## 2014-08-24 NOTE — Significant Event (Signed)
Patient refuses AM medications due for 10am. Patient not interactive at all; wants RN to leave him alone and covers himself in blankets. Spoke with Dr. Joseph Art and made him aware. Micaiah Litle, Charity fundraiser.

## 2014-08-24 NOTE — Progress Notes (Signed)
OT Cancellation Note  Patient Details Name: Andrew Dunn MRN: [id number] DOB: November 12, 1925   Cancelled Treatment:    Reason Eval/Treat Not Completed: OT screened, no needs identified, will sign off. Pt is a SNF resident. Per pt report, he transfers with one person assist and walks some with a walker, but uses a w/c as his primary means of mobility.  He is dependent in bathing, dressing and toileting and is self feeding.  Plan to defer OT to SNF.  Evern Bio 08/24/2014, 10:41 AM  [phone]

## 2014-08-24 NOTE — Progress Notes (Signed)
Speech Pathology:  CHL IP CLINICAL IMPRESSIONS 08/24/2014  Therapy Diagnosis Mild pharyngeal phase dysphagia  Clinical Impression Pt presents with a mlld oropharyngeal dysphagia marked primarily by prolonged oral preparation of mechanical consistencies, mild swallow delay, mod pharyngeal residue.  When consuming thin liquids in isolation, pt demonstrates adequate airway protection with intermittent high penetration underlying the epiglottis.  When drinking thins in conjunction with eating solids, the solids collect in the valleculae, and the thins are more likely to penetrate the larynx to the vocal cords.  Penetration elicits a protective cough, which ejects material from larynx.  No aspiration was observed.  Recommend resuming a regular diet with thin liquids; meds should be crushed and given in puree given risk of lodging in valleculae.  Pt should alternate solids/liquids to maximize safety.  SLP will f/u for education and diet toleration.   Plan: regular diet, thin liquids; meds crushed in puree.     Jah Alarid L. Samson Frederic, [place] CCC/SLP Pager [phone]

## 2014-08-24 NOTE — Progress Notes (Signed)
Pharmacist Heart Failure Core Measure Documentation  Assessment: Andrew Dunn has an EF documented as 35% on 08/23/14 by ECHO.  Rationale: Heart failure patients with left ventricular systolic dysfunction (LVSD) and an EF < 40% should be prescribed an angiotensin converting enzyme inhibitor (ACEI) or angiotensin receptor blocker (ARB) at discharge unless a contraindication is documented in the medical record.  This patient is not currently on an ACEI or ARB for HF.  This note is being placed in the record in order to provide documentation that a contraindication to the use of these agents is present for this encounter.  ACE Inhibitor or Angiotensin Receptor Blocker is contraindicated (specify all that apply)  []   ACEI allergy AND ARB allergy []   Angioedema []   Moderate or severe aortic stenosis []   Hyperkalemia []   Hypotension []   Renal artery stenosis [x]   Worsening renal function, preexisting renal disease or dysfunction   [name] 08/24/2014 10:20 AM

## 2014-08-24 NOTE — Progress Notes (Signed)
[place] TEAM 1 - Stepdown/ICU TEAM Progress Note  Andrew Dunn:[id number] DOB: 27-Jul-1925 DOA: 08/22/2014 PCP: Andrew Griffes, MD  Admit HPI / Brief Narrative: 81 y.o. WM PMHx Parkinson's dementia, hypertension, type 2 diabetes mellitus, chronic kidney disease stage III, history of CHF, remote tobacco history quit in 2009, [facility] resident [facility], Presents to the ED with sudden onset hypoxia with some shortness of breath, coughing, wheezing and congestion. Patient with a history of dementia and a such that history was obtained from patient's daughter as well as per ED physician. Per daughter over the past weekend patient has had worsening lower extremity edema with urinary retention and a Foley catheter was placed. Patient's daughter stated that the night prior to admission patient had a low-grade temp of 100.2. Patient denied any chest pain no shortness of breath no abdominal pain no dysuria. Patient's daughter states patient has had decreased oral intake with generalized weakness. He was also noted that patient pulled out his Foley catheter on the day of admission. On the day of admission patient ate his lunch only ate a small bite and drank a little bit of his drink. Patient was subsequently noted later on to have some coughing with some wheezing and some congestion noted by nursing that facility. Patient also noted with emesis. Patient was seen in the emergency room and noted to have a temp of 102.2. Patient was noted to be tachycardic heart rate as high as 113 and tachypnea With a respiratory rate as high as 37. ABG which was obtained had a pH of 7.34, PCO2 of 47, PO2 of 150. CBC obtained had a white count of 16.2 hemoglobin of 11.3 otherwise was within normal limits. Basic metabolic profile had a potassium of 3.0 BUN of 40 creatinine of 1.96 otherwise was within normal limits. BNP was elevated at 772.4. First set of point-of-care troponin was elevated at 1.11. Lactic acid  level is 1.47. Chest x-ray showed mild enlargement of cardiopericardial silhouette without edema. Stable elevation of right hemidiaphragm with mild atelectasis at the right lung base. Atherosclerotic aortic arch. Urinalysis done was cloudy many bacteria large leukocytes, negative nitrite, greater than 300 protein, too numerous to count RBCs, too numerous to count WBCs. Patient was given a dose of IV antibiotics. [facility] were called to admit the patient for further evaluation and management.  HPI/Subjective: 5/20  A/O 1 (does not know where, when, why), states negative CP, negative SOB.   Assessment/Plan: sepsis secondary to UTI/HCAP/Aspiration pneumonia -Likely secondary to urinary tract infection/ HCAP? -Continue current antibiotics -DuoNeb QID -Flutter valve -Solu-Medrol 60 mg daily  Acute respiratory failure with hypoxia -Titrate O2 to maintain SPO2> 93%  urinary tract infection -Positive GNR; current antibiotics should cover most organisms await speciation and sensitivities  Leukocytosis -See sepsis  STEMI -Cardiology aware but secondary to patient's multiple comorbidities medical management only -See echocardiogram below  -Trend troponins  -Continue heparin drip  -Continue aspirin 81 mg daily  -TSH slightly low, however free T4 within normal limit  -Strict in and out since admission; + 885, attempt to maintain euvolemic -Lasix 40 mg 1  A. fib RVR -Currently rate controlled.  -Continue metoprolol 12.5 mg BID.  Acute systolic CHF  See STEMI  elevated troponin/abnormal EKG -See STEMI -Cardiology consult; await recommendations  acute on chronic renal failure (Baseline creatinine 1.3-1.5). - Likely secondary to a prerenal azotemia secondary to acute CHF exacerbation.  -Renal ultrasound pending.  -See STEMI  Hypokalemia -Potassium goal> 4 -.  Diabetes  mellitus Type 2uncontrolled -5/18 Hemoglobin A1c = 8.3  -Increase Lantus 20 units daily -Increase to  resistant SSI  Parkinson's disease -Sinemet 25-250 mg TID .  urinary retention -Maintain Foley catheter. -Doxazosin  QHS      Code Status: FULL Family Communication: no family present at time of exam Disposition Plan: Resolution sepsis/STEMI    Consultants: Dr.Katarina San Morelle, (cardiology)    Procedure/Significant Events: 5/19 echocardiogram;- LVEF is approximately 35% with hypokinesis prox/mid inferior wall; akiensis of the distal inferior, distal anterior, distal septal and apex. -mild LVH. -Pulmonary arteries: PA peak pressure: 43 mm Hg (S).   Culture 5/18 urine positive GNR 5/18 blood right forearm 2 NGTD 5/18 MRSA by PCR negative 5/18 influenza A/B/H1N1 negative 5/18 HIV negative 5/18 Legionella urine antigen negative; and strep pneumo urine antigen negative    Antibiotics: Cefepime 5/18>> Vancomycin 5/18>>   DVT prophylaxis: Heparin drip    Devices   LINES / TUBES:      Continuous Infusions: . diltiazem (CARDIZEM) infusion 5 mg/hr (08/24/14 0700)  . heparin 1,350 Units/hr (08/24/14 1045)    Objective: VITAL SIGNS: Temp: 98.5 F (36.9 C) (05/20 1223) Temp Source: Oral (05/20 1223) BP: 151/55 mmHg (05/20 1223) Pulse Rate: 86 (05/20 1223) SPO2; FIO2:   Intake/Output Summary (Last 24 hours) at 08/24/14 1317 Last data filed at 08/24/14 1200  Gross per 24 hour  Intake 2224.73 ml  Output    925 ml  Net 1299.73 ml     Exam: General: A/O 1 (does not know where, when, why), follows all commands, No acute respiratory distress Eyes: Negative retinal hemorrhage, pupils equal round reactive to light and accommodation ENT: Negative Runny nose,  negative gingival bleeding,  Neck: Negative scars, masses, torticollis, lymphadenopathy, JVD Lungs: Clear to auscultation bilaterally without wheezes or crackles Cardiovascular: Irregular irregular rhythm and rate, negative murmur gallop or rub normal S1 and S2 Abdomen: Nontender,  nondistended, soft, bowel sounds positive, no rebound, no ascites, no appreciable mass Extremities: No significant cyanosis, clubbing. Bilateral lower extremity edema 2+ pitting to mid thigh Psychiatric: Negative depression, negative anxiety, negative fatigue, negative mania  Neurologic: Cranial nerves II through XII intact, all extremities muscle strength 5/5,negative dysarthria, negative expressive aphasia, negative receptive aphasia.      Data Reviewed: Basic Metabolic Panel:  Recent Labs Lab 08/22/14 1546 08/22/14 1924 08/23/14 0909 08/23/14 1207 08/24/14 0112  NA 138 139 139 140 138  K 3.0* 3.5 3.6 3.6 3.9  CL 101 99* 104 104 104  CO2 GLUCOSE 305* 295* 190* 190* 152*  BUN 40* 41* 45* 46* 50*  CREATININE 1.96* 2.14* 2.31* 2.33* 2.73*  CALCIUM 7.9* 7.8* 8.0* 7.7* 8.3*  MG  --  1.7  --  2.6* 2.5*   Liver Function Tests:  Recent Labs Lab 08/22/14 1924 08/23/14 1207 08/24/14 0112  AST ALT 14* 5* 6*  ALKPHOS 65 59 66  BILITOT 1.0 0.6 0.4  PROT 6.1* 5.3* 6.1*  ALBUMIN 2.6* 2.1* 2.3*   No results for input(s): LIPASE, AMYLASE in the last 168 hours. No results for input(s): AMMONIA in the last 168 hours. CBC:  Recent Labs Lab 08/22/14 1546 08/23/14 0909 08/24/14 0112  WBC 16.2* 13.5* 12.5*  NEUTROABS  --   --  11.2*  HGB 11.3* 10.2* 10.4*  HCT 35.2* 31.3* 32.6*  MCV 85.9 85.3 85.3  PLT 200 184 208   Cardiac Enzymes:  Recent Labs Lab 08/22/14 1924 08/23/14 0909 08/23/14 1207 08/23/14  1929 08/24/14 0117  TROPONINI 2.74* 4.26* 4.44* 3.66* 3.27*   BNP (last 3 results)  Recent Labs  08/22/14 1546  BNP 772.4*    ProBNP (last 3 results) No results for input(s): PROBNP in the last 8760 hours.  CBG:  Recent Labs Lab 08/23/14 1707 08/23/14 2106 08/24/14 0031 08/24/14 0416 08/24/14 1221  GLUCAP 176* 257* 154* 195* 320*    Recent Results (from the past 240 hour(s))  Urine culture     Status: None (Preliminary  result)   Collection Time: 08/22/14  3:28 PM  Result Value Ref Range Status   Specimen Description URINE, CATHETERIZED  Final   Special Requests NONE  Final   Colony Count   Final    >=100,000 COLONIES/ML Performed at [facility]    Culture   Final    GRAM NEGATIVE RODS Performed at [facility]    Report Status PENDING  Incomplete  Blood Culture (routine x 2)     Status: None (Preliminary result)   Collection Time: 08/22/14  3:43 PM  Result Value Ref Range Status   Specimen Description BLOOD RIGHT FOREARM  Final   Special Requests BOTTLES DRAWN AEROBIC AND ANAEROBIC 5CC  Final   Culture   Final           BLOOD CULTURE RECEIVED NO GROWTH TO DATE CULTURE WILL BE HELD FOR 5 DAYS BEFORE ISSUING A FINAL NEGATIVE REPORT Performed at [facility]    Report Status PENDING  Incomplete  Blood Culture (routine x 2)     Status: None (Preliminary result)   Collection Time: 08/22/14  4:22 PM  Result Value Ref Range Status   Specimen Description BLOOD RIGHT HAND  Final   Special Requests BOTTLES DRAWN AEROBIC ONLY 3CC  Final   Culture   Final           BLOOD CULTURE RECEIVED NO GROWTH TO DATE CULTURE WILL BE HELD FOR 5 DAYS BEFORE ISSUING A FINAL NEGATIVE REPORT Performed at [facility]    Report Status PENDING  Incomplete  MRSA PCR Screening     Status: None   Collection Time: 08/22/14  6:56 PM  Result Value Ref Range Status   MRSA by PCR NEGATIVE NEGATIVE Final    Comment:        The GeneXpert MRSA Assay (FDA approved for NASAL specimens only), is one component of a comprehensive MRSA colonization surveillance program. It is not intended to diagnose MRSA infection nor to guide or monitor treatment for MRSA infections.      Studies:  Recent x-ray studies have been reviewed in detail by the Attending Physician  Scheduled Meds:  Scheduled Meds: . antiseptic oral rinse  7 mL Mouth Rinse BID  . aspirin EC  81 mg Oral Daily  .  carbidopa-levodopa  1 tablet Oral TID  . ceFEPime (MAXIPIME) IV  1 g Intravenous Q24H  . doxazosin  8 mg Oral QHS  . insulin aspart  0-20 Units Subcutaneous 6 times per day  . insulin glargine  14 Units Subcutaneous QHS  . ipratropium-albuterol  3 mL Nebulization Q6H  . methylPREDNISolone (SOLU-MEDROL) injection  60 mg Intravenous Q24H  . metoprolol tartrate  12.5 mg Oral BID  . mirtazapine  15 mg Oral QHS  . potassium chloride  40 mEq Oral Daily  . sertraline  50 mg Oral Daily  . sodium chloride  3 mL Intravenous Q12H  . [START ON 08/25/2014] vancomycin  1,000 mg Intravenous Q48H  Time spent on care of this patient: 40 mins   Shaylynn Nulty, Roselind Messier , MD  [facility] Office  [phone] Pager [phone]  On-Call/Text Page:      [email or web address]      password [email or web address]  If 7PM-7AM, please contact night-coverage [email or web address] Password [email or web address] 08/24/2014, 1:17 PM   LOS: 2 days   Care during the described time interval was provided by me .  I have reviewed this patient's available data, including medical history, events of note, physical examination, radiology studies and test results as part of my evaluation  Carolyne Littles, MD [phone] Pager

## 2014-08-25 DIAGNOSIS — N189 Chronic kidney disease, unspecified: Secondary | ICD-10-CM

## 2014-08-25 DIAGNOSIS — A419 Sepsis, unspecified organism: Principal | ICD-10-CM

## 2014-08-25 LAB — CBC WITH DIFFERENTIAL/PLATELET
BASOS PCT: 0 % (ref 0–1)
Basophils Absolute: 0 10*3/uL (ref 0.0–0.1)
Eosinophils Absolute: 0 10*3/uL (ref 0.0–0.7)
Eosinophils Relative: 0 % (ref 0–5)
HCT: 33.5 % — ABNORMAL LOW (ref 39.0–52.0)
HEMOGLOBIN: 10.8 g/dL — AB (ref 13.0–17.0)
Lymphocytes Relative: 8 % — ABNORMAL LOW (ref 12–46)
Lymphs Abs: 1 10*3/uL (ref 0.7–4.0)
MCH: 27.4 pg (ref 26.0–34.0)
MCHC: 32.2 g/dL (ref 30.0–36.0)
MCV: 85 fL (ref 78.0–100.0)
Monocytes Absolute: 0.3 10*3/uL (ref 0.1–1.0)
Monocytes Relative: 3 % (ref 3–12)
NEUTROS PCT: 89 % — AB (ref 43–77)
Neutro Abs: 11.8 10*3/uL — ABNORMAL HIGH (ref 1.7–7.7)
Platelets: 246 10*3/uL (ref 150–400)
RBC: 3.94 MIL/uL — ABNORMAL LOW (ref 4.22–5.81)
RDW: 13.9 % (ref 11.5–15.5)
WBC: 13.2 10*3/uL — AB (ref 4.0–10.5)

## 2014-08-25 LAB — COMPREHENSIVE METABOLIC PANEL
ALK PHOS: 73 U/L (ref 38–126)
ALT: 8 U/L — AB (ref 17–63)
ANION GAP: 9 (ref 5–15)
AST: 18 U/L (ref 15–41)
Albumin: 2.2 g/dL — ABNORMAL LOW (ref 3.5–5.0)
BUN: 66 mg/dL — ABNORMAL HIGH (ref 6–20)
CO2: 25 mmol/L (ref 22–32)
CREATININE: 2.63 mg/dL — AB (ref 0.61–1.24)
Calcium: 8.1 mg/dL — ABNORMAL LOW (ref 8.9–10.3)
Chloride: 101 mmol/L (ref 101–111)
GFR calc non Af Amer: 20 mL/min — ABNORMAL LOW (ref >60)
GFR, EST AFRICAN AMERICAN: 24 mL/min — AB (ref >60)
Glucose, Bld: 296 mg/dL — ABNORMAL HIGH (ref 65–99)
Potassium: 4.8 mmol/L (ref 3.5–5.1)
SODIUM: 135 mmol/L (ref 135–145)
Total Bilirubin: 0.3 mg/dL (ref 0.3–1.2)
Total Protein: 5.9 g/dL — ABNORMAL LOW (ref 6.5–8.1)

## 2014-08-25 LAB — GLUCOSE, CAPILLARY
GLUCOSE-CAPILLARY: 284 mg/dL — AB (ref 65–99)
GLUCOSE-CAPILLARY: 225 mg/dL — AB (ref 65–99)
Glucose-Capillary: 218 mg/dL — ABNORMAL HIGH (ref 65–99)
Glucose-Capillary: 266 mg/dL — ABNORMAL HIGH (ref 65–99)
Glucose-Capillary: 295 mg/dL — ABNORMAL HIGH (ref 65–99)
Glucose-Capillary: 401 mg/dL — ABNORMAL HIGH (ref 65–99)
Glucose-Capillary: 405 mg/dL — ABNORMAL HIGH (ref 65–99)
Glucose-Capillary: 434 mg/dL — ABNORMAL HIGH (ref 65–99)

## 2014-08-25 LAB — HEPARIN LEVEL (UNFRACTIONATED): HEPARIN UNFRACTIONATED: 0.38 [IU]/mL (ref 0.30–0.70)

## 2014-08-25 LAB — MAGNESIUM: Magnesium: 2.7 mg/dL — ABNORMAL HIGH (ref 1.7–2.4)

## 2014-08-25 MED ORDER — CEPHALEXIN 500 MG PO CAPS
500.0000 mg | ORAL_CAPSULE | Freq: Two times a day (BID) | ORAL | Status: DC
  Filled 2014-08-25: qty 1

## 2014-08-25 MED ORDER — CEPHALEXIN 250 MG/5ML PO SUSR
500.0000 mg | Freq: Two times a day (BID) | ORAL | Status: DC
  Administered 2014-08-25 – 2014-08-28 (×7): 500 mg via ORAL
  Filled 2014-08-25 (×10): qty 10

## 2014-08-25 MED ORDER — METOPROLOL TARTRATE 25 MG PO TABS
25.0000 mg | ORAL_TABLET | Freq: Two times a day (BID) | ORAL | Status: DC
  Administered 2014-08-25 – 2014-08-27 (×5): 25 mg via ORAL
  Filled 2014-08-25 (×5): qty 1

## 2014-08-25 NOTE — Progress Notes (Addendum)
Patient Name: Andrew Dunn Date of Encounter: 08/25/2014  Principal Problem:   Sepsis Active Problems:   Dementia   Diabetes mellitus   Hypokalemia   Parkinson's disease   Pernicious anemia   Osteopenia   Acute respiratory failure with hypoxia   Acute exacerbation of CHF (congestive heart failure)   HCAP (healthcare-associated pneumonia)   Elevated troponin   UTI (lower urinary tract infection)   Acute renal failure superimposed on stage 3 chronic kidney disease   Leukocytosis   Abnormal EKG   Urinary retention   Sepsis secondary to UTI   Aspiration pneumonia   Heart attack   Atrial fibrillation with RVR   Acute systolic CHF (congestive heart failure)   Acute on chronic renal failure   Diabetes type 2, uncontrolled   Length of Stay: 3  SUBJECTIVE  The patient denies chest pain or SOB, the nurse states that he was confused at night.  His echocardiogram was poor quality even with the use of echo contrast. His left ventricle systolic function was found to be moderately to severely reduced with an EF of 35%. He has moderate pulmonary artery hypertension with a PA pressure 43.  CURRENT MEDS . antiseptic oral rinse  7 mL Mouth Rinse BID  . aspirin EC  81 mg Oral Daily  . carbidopa-levodopa  1 tablet Oral TID  . ceFEPime (MAXIPIME) IV  1 g Intravenous Q24H  . doxazosin  8 mg Oral QHS  . insulin aspart  0-20 Units Subcutaneous 6 times per day  . insulin glargine  20 Units Subcutaneous QHS  . methylPREDNISolone (SOLU-MEDROL) injection  60 mg Intravenous Q24H  . metoprolol tartrate  12.5 mg Oral BID  . mirtazapine  15 mg Oral QHS  . potassium chloride  40 mEq Oral Daily  . sertraline  50 mg Oral Daily  . sodium chloride  3 mL Intravenous Q12H  . vancomycin  1,000 mg Intravenous Q48H   . diltiazem (CARDIZEM) infusion 5 mg/hr (08/25/14 0626)  . heparin 1,350 Units/hr (08/25/14 0627)    OBJECTIVE  Filed Vitals:   08/24/14 2002 08/24/14 2032 08/24/14 2238  08/25/14 0454  BP: 153/54 153/54  141/64  Pulse: 80 81 78 65  Temp:  98.8 F (37.1 C)  98.1 F (36.7 C)  TempSrc:  Oral  Oral  Resp:  20  22  Height:      Weight:    88.542 kg (195 lb 3.2 oz)  SpO2: 89% 92%  96%    Intake/Output Summary (Last 24 hours) at 08/25/14 0940 Last data filed at 08/25/14 0846  Gross per 24 hour  Intake 1557.28 ml  Output    950 ml  Net 607.28 ml   Filed Weights   08/23/14 0400 08/24/14 0500 08/25/14 0454  Weight: 90.266 kg (199 lb) 91.128 kg (200 lb 14.4 oz) 88.542 kg (195 lb 3.2 oz)    PHYSICAL EXAM  General: Pleasant, NAD. Neuro: Alert and oriented X 3. Moves all extremities spontaneously. Psych: Normal affect. HEENT:  Normal  Neck: Supple without bruits or JVD. Lungs:  Resp regular and unlabored, CTA. Heart: RRR no s3, s4, or murmurs. Abdomen: Soft, non-tender, non-distended, BS + x 4.  Extremities: No clubbing, cyanosis or edema. DP/PT/Radials 2+ and equal bilaterally.  Accessory Clinical Findings  CBC  Recent Labs  08/24/14 0112 08/25/14 0538  WBC 12.5* 13.2*  NEUTROABS 11.2* 11.8*  HGB 10.4* 10.8*  HCT 32.6* 33.5*  MCV 85.3 85.0  PLT 208 246  Basic Metabolic Panel  Recent Labs  08/24/14 0112 08/25/14 0538  NA 138 135  K 3.9 4.8  CL 104 101  CO2 26 25  GLUCOSE 152* 296*  BUN 50* 66*  CREATININE 2.73* 2.63*  CALCIUM 8.3* 8.1*  MG 2.5* 2.7*   Liver Function Tests  Recent Labs  08/24/14 0112 08/25/14 0538  AST 30 18  ALT 6* 8*  ALKPHOS 66 73  BILITOT 0.4 0.3  PROT 6.1* 5.9*  ALBUMIN 2.3* 2.2*   Cardiac Enzymes  Recent Labs  08/23/14 1207 08/23/14 1929 08/24/14 0117  TROPONINI 4.44* 3.66* 3.27*    Recent Labs  08/22/14 1924  TSH 0.161*    Radiology/Studies  Dg Chest Port 1 View  08/23/2014   CLINICAL DATA:  Shortness of breath, elevated right hemidiaphragm.   IMPRESSION: Slight interval worsening in the appearance of the pulmonary interstitium suggesting low-grade edema secondary to CHF.  There is right basilar atelectasis.     Dg Chest Port 1 View  08/22/2014   CLINICAL DATA:  Shortness of breath.  Diabetes.  IMPRESSION: 1. Mild enlargement of the cardiopericardial silhouette, without edema. 2. Stable elevation of the right hemidiaphragm, with mild atelectasis at the right lung base. 3. Atherosclerotic aortic arch.     TELE: SR to a-fib with RVR this am     ASSESSMENT AND PLAN  1. Acute respiratory failure most likely secondary to aspiration +/- CHF and Acute MI. He has diffuse inspiratory and expiratory wheezes on exam. No edema noted on chest xray although he does appear volume overloaded with elevated BNP and LE edema.   2. Sepsis most likely secondary to UTI +/- PNA - per TRH  3. Elevated troponin, the patient developed STEMI in the anterior leads, that are new when compared to yesterday's ECG, bedside echo shows LVEF 30-25% with akinetis entire anterior, anteroseptal walls and apex.  There is contribution of the sepsis and new a-fib  With RVR worsening the demand ischemia.  Considering patient's underlying dementia, stage IV kidney function that is worsening we will treat conservatively. Metoprolol was added yesterday, continue ASA, heparin drip and atorvastatin.   4. Acute on chronic renal insufficiency stage III. We will hold diuretics.   5. Acute CHF with increased LE edema and elevated BNP. Continue IV diuretics and follow renal function closely. Check 2D echo to assess LVF  6. New onset a-fib with RVR - we will start low dose cardizem drip.  Transition to PO beta blocker instead of dilt given his low EF    Signed, Netty Sullivant, Deloris Ping MD, [facility] 08/25/2014   Addendum: He has converted to NSR Will DC dilt drip.  Increase metoprolol   Marli Diego, Deloris Ping, MD  08/25/2014 10:42 AM    [facility] [address] [place],  [address] [place], [place]  [address] Pager [phone] Phone: [phone]; Fax: [phone]   [facility]  [address] [place], [place]  [address] [phone]    Fax [phone]

## 2014-08-25 NOTE — Progress Notes (Signed)
Pt with rales and wheezes bilateral throughout on exam with edema noted BLE.  Pt slightly labored at rest with saturation 96% on 2l [place].  Noted lasix on hold. Lenny Pastel, NP notified.  Will continue to monitor closely.

## 2014-08-25 NOTE — Progress Notes (Signed)
ANTICOAGULATION & ANTIBIOTIC CONSULT NOTE - Follow Up Consult  Pharmacy Consult for Heparin; vancomycin, cefepime Indication: chest pain/ACS and atrial fibrillation; UTI and rule out pneumonia  No Known Allergies  Patient Measurements: Height: 5' 6.93" (170 cm) Weight: 195 lb 3.2 oz (88.542 kg) IBW/kg (Calculated) : 65.94 Heparin Dosing Weight: 83.7 kg  Vital Signs: Temp: 99 F (37.2 C) (05/21 1317) Temp Source: Oral (05/21 1317) BP: 143/63 mmHg (05/21 1317) Pulse Rate: 68 (05/21 1317)  Labs:  Recent Labs  08/22/14 1546  08/23/14 0909 08/23/14 1207 08/23/14 1929 08/24/14 0112 08/24/14 0117 08/25/14 0538  HGB 11.3*  --  10.2*  --   --  10.4*  --  10.8*  HCT 35.2*  --  31.3*  --   --  32.6*  --  33.5*  PLT 200  --  184  --   --  208  --  246  LABPROT 14.7  --   --   --   --   --   --   --   INR 1.13  --   --   --   --   --   --   --   HEPARINUNFRC  --   < > <0.10*  --  0.36  --  0.44 0.38  CREATININE 1.96*  < > 2.31* 2.33*  --  2.73*  --  2.63*  TROPONINI  --   < > 4.26* 4.44* 3.66*  --  3.27*  --   < > = values in this interval not displayed.  Estimated Creatinine Clearance: 21.4 mL/min (by C-G formula based on Cr of 2.63).   Medications:  Infusions:  . heparin 1,350 Units/hr (08/25/14 8177)    Assessment: 81 year old male on IV heparin for acute MI and new Afib. Heparin level is therapeutic on 1350 units/hr. CBC is low-stable. No bleeding reported.   Urine cx grew out proteus so abx have been de-escalated to keflex to complete 7 days  Vanc 5/18>> 5/21 Cefepime 5/18>> 5/21 Zosyn 5/18 x1 dose ED. Keflex 5/21>>   5/18 UCx>> proteus Sens- cefaz, roc Res- cipro, septra 5/18 BCx2>> ngtd 5/18 sputum cx - no result  Goal of Therapy:  Heparin level 0.3-0.7 units/ml Monitor platelets by anticoagulation protocol: Yes    Plan:   Continue heparin at 1350 units/hr.  Daily heparin level and CBC while on therapy.   Ulyses Southward, PharmD Pager:  [phone] 08/25/2014 2:16 PM

## 2014-08-25 NOTE — Progress Notes (Signed)
[facility] Progress Note   Andrew Dunn ZOX:[id number] DOB: 1925-06-17 DOA: 08/22/2014 PCP: Verneita Griffes, MD  Brief narrative: Andrew Dunn is a 81 y.o. male who resides in a nursing home with a past medical history of Parkinson's disease, dementia, hypertension, diabetes mellitus type 2, chronic kidney disease stage III, congestive heart failure who presented to the hospital with shortness of breath coughing and was found to be hypoxic and wheezing. There is a suspicion that he may have aspirated while he was eating as he suddenly developed coughing and wheezing and congestion after lunch. He had a temperature of 100.2 on the night prior to admission. Initial chest x-ray did not reveal any evidence of pneumonia. He was also noted to have a UTI. He had a Foley placed over the weekend prior to admission for urinary retention. He pulled the Foley out on the day that he was admitted. He was also noted to have acute renal failure and an elevated troponin of 1.11.    Subjective: Patient is awake and alert. He has no complaints of shortness of breath, chest pain nausea vomiting abdominal pain or diarrhea.  Assessment/Plan: Principal Problem:   Sepsis - Suspected to be secondary to urinary tract infection and possible aspiration pneumonia -He was started on vancomycin and cefepime on 5/18  Active Problems: UTI possibly in relation to Foley catheter placed prior to admission -Urine culture growing Proteus which is sensitive to cephalosporins- will transition to Keflex and continue to complete a seven-day course  Urinary retention -Continue Foley catheter and doxazosin  Possible aspiration pneumonia -He's had 2 chest x-rays since admission both of which have been free of infiltrate -Oxygen saturation is now 96% on room air -Will discontinue vancomycin and cefepime as there are no signs of pneumonia either on CXR or clinically and respiratory status has improved  significantly  Acute respiratory failure with hypoxia -Suspected to be either due to aspiration mentioned above versus acute CHF exacerbation on admission -As been diuresed appropriately and also given vancomycin and cefepime since 5/18 - SLP eval performed on 5/20 reveals mild pharyngeal phase dysphagia but no overt aspiration  -As mentioned, he is now on room air with good O2 sats  Acute systolic congestive heart failure  -2-D echo performed on 5/19 reveals an EF of 35% multiple areas of hypokinesis and akinesis-report mentioned below - IV diuretics given but currently on hold due to worsening renal function-continue to follow I and O's and daily weights -Respiratory status stable at this time -Cardiology assisting with management  STEMI -Cardiology is following-  patient has multiple comorbidities and at this time only medical management is appropriate for him -He is on a heparin infusion, metoprolol, atorvastatin and aspirin  New onset atrial fibrillation with RVR - CHA2DS2-VASc Score- 5 - Has converted to sinus rhythm and therefore Cardizem infusion is being discontinued -Continue metoprolol at 25 mg twice a day -Currently on a heparin infusion for anticoagulation  Acute on chronic renal failure- chronic kidney disease stage III - Follow creatinine-dose diuretics appropriately -renal ultrasound reveals echogenic renal parenchyma bilaterally without hydronephrosis  Hypokalemia -Likely in relation to diuretics-currently replaced adequately-continue to check and replace as needed  2 diabetes mellitus uncontrolled -A1c 8.3 -Continue Lantus and sliding scale insulin  Parkinson's disease -Continue Sinemet    Dementia   Code Status: DO NOT RESUSCITATE Family Communication:  Disposition Plan: SNF vs home with 24 hr supervision DVT prophylaxis: heparin infusion Consultants:Cardiology Procedures: 2-D echo Left ventricle: Poor acoustic windows even with  Definity  use. Ovreall LVEF is approximately 35% with hypokinesis of the prox/mid inferior wall; akiensis of the distal inferior, distal anterior, distal septal and apex. The cavity size was mildly dilated. Wall thickness was increased in a pattern of mild LVH. - Aortic valve: AV is thickened, calcified with minimally restricted motion. - Mitral valve: There was mild regurgitation. - Left atrium: The atrium was mildly dilated. - Pulmonary arteries: PA peak pressure: 43 mm Hg (S).  Antibiotics: Anti-infectives    Start     Dose/Rate Route Frequency Ordered Stop   08/25/14 1730  vancomycin (VANCOCIN) IVPB 1000 mg/200 mL premix     1,000 mg 200 mL/hr over 60 Minutes Intravenous Every 48 hours 08/24/14 1005     08/23/14 1730  vancomycin (VANCOCIN) IVPB 1000 mg/200 mL premix  Status:  Discontinued     1,000 mg 200 mL/hr over 60 Minutes Intravenous Every 24 hours 08/22/14 1915 08/24/14 1005   08/22/14 2200  ceFEPIme (MAXIPIME) 1 g in dextrose 5 % 50 mL IVPB  Status:  Discontinued     1 g 100 mL/hr over 30 Minutes Intravenous 3 times per day 08/22/14 1851 08/22/14 1921   08/22/14 2000  ceFEPIme (MAXIPIME) 1 g in dextrose 5 % 50 mL IVPB     1 g 100 mL/hr over 30 Minutes Intravenous Every 24 hours 08/22/14 1921     08/22/14 1545  vancomycin (VANCOCIN) 1,660 mg in sodium chloride 0.9 % 500 mL IVPB     20 mg/kg  83 kg (Order-Specific) 250 mL/hr over 120 Minutes Intravenous  Once 08/22/14 1542 08/22/14 1936   08/22/14 1545  piperacillin-tazobactam (ZOSYN) IVPB 3.375 g     3.375 g 100 mL/hr over 30 Minutes Intravenous  Once 08/22/14 1542 08/22/14 1708      Objective: Filed Weights   08/23/14 0400 08/24/14 0500 08/25/14 0454  Weight: 90.266 kg (199 lb) 91.128 kg (200 lb 14.4 oz) 88.542 kg (195 lb 3.2 oz)    Intake/Output Summary (Last 24 hours) at 08/25/14 0933 Last data filed at 08/25/14 0846  Gross per 24 hour  Intake 1557.28 ml  Output    950 ml  Net 607.28 ml     Vitals Filed  Vitals:   08/24/14 2002 08/24/14 2032 08/24/14 2238 08/25/14 0454  BP: 153/54 153/54  141/64  Pulse: 80 81 78 65  Temp:  98.8 F (37.1 C)  98.1 F (36.7 C)  TempSrc:  Oral  Oral  Resp:  20  22  Height:      Weight:    88.542 kg (195 lb 3.2 oz)  SpO2: 89% 92%  96%    Exam:  General:  Pt is alert, confused to time and situation, not in acute distress  HEENT: No icterus, No thrush  Cardiovascular: regular rate and rhythm, S1/S2 No murmur  Respiratory: clear to auscultation bilaterally   Abdomen: Soft, +Bowel sounds, non tender, non distended, no guarding  MSK:  2 + LE edema- no cyanosis or clubbing  Data Reviewed: Basic Metabolic Panel:  Recent Labs Lab 08/22/14 1924 08/23/14 0909 08/23/14 1207 08/24/14 0112 08/25/14 0538  NA 139 139 140 138 135  K 3.5 3.6 3.6 3.9 4.8  CL 99* 104 104 104 101  CO2 27 25 25 26 25   GLUCOSE 295* 190* 190* 152* 296*  BUN 41* 45* 46* 50* 66*  CREATININE 2.14* 2.31* 2.33* 2.73* 2.63*  CALCIUM 7.8* 8.0* 7.7* 8.3* 8.1*  MG 1.7  --  2.6* 2.5* 2.7*  Liver Function Tests:  Recent Labs Lab 08/22/14 1924 08/23/14 1207 08/24/14 0112 08/25/14 0538  AST 28 27 30 18   ALT 14* 5* 6* 8*  ALKPHOS 65 59 66 73  BILITOT 1.0 0.6 0.4 0.3  PROT 6.1* 5.3* 6.1* 5.9*  ALBUMIN 2.6* 2.1* 2.3* 2.2*   No results for input(s): LIPASE, AMYLASE in the last 168 hours. No results for input(s): AMMONIA in the last 168 hours. CBC:  Recent Labs Lab 08/22/14 1546 08/23/14 0909 08/24/14 0112 08/25/14 0538  WBC 16.2* 13.5* 12.5* 13.2*  NEUTROABS  --   --  11.2* 11.8*  HGB 11.3* 10.2* 10.4* 10.8*  HCT 35.2* 31.3* 32.6* 33.5*  MCV 85.9 85.3 85.3 85.0  PLT 200 184 208 246   Cardiac Enzymes:  Recent Labs Lab 08/22/14 1924 08/23/14 0909 08/23/14 1207 08/23/14 1929 08/24/14 0117  TROPONINI 2.74* 4.26* 4.44* 3.66* 3.27*   BNP (last 3 results)  Recent Labs  08/22/14 1546  BNP 772.4*    ProBNP (last 3 results) No results for input(s):  PROBNP in the last 8760 hours.  CBG:  Recent Labs Lab 08/24/14 1601 08/24/14 2111 08/25/14 0011 08/25/14 0501 08/25/14 0743  GLUCAP 380* 300* 295* 266* 284*    Recent Results (from the past 240 hour(s))  Urine culture     Status: None   Collection Time: 08/22/14  3:28 PM  Result Value Ref Range Status   Specimen Description URINE, CATHETERIZED  Final   Special Requests NONE  Final   Colony Count   Final    >=100,000 COLONIES/ML Performed at [facility]    Culture   Final    PROTEUS MIRABILIS Performed at [facility]    Report Status 08/24/2014 FINAL  Final   Organism ID, Bacteria PROTEUS MIRABILIS  Final      Susceptibility   Proteus mirabilis - MIC*    AMPICILLIN >=32 RESISTANT Resistant     CEFAZOLIN <=1 SENSITIVE Sensitive     CEFTRIAXONE <=1 SENSITIVE Sensitive     CIPROFLOXACIN >=4 RESISTANT Resistant     GENTAMICIN <=1 SENSITIVE Sensitive     LEVOFLOXACIN >=8 RESISTANT Resistant     NITROFURANTOIN 128 RESISTANT Resistant     TOBRAMYCIN <=1 SENSITIVE Sensitive     TRIMETH/SULFA >=320 RESISTANT Resistant     PIP/TAZO RESISTANT      * PROTEUS MIRABILIS  Blood Culture (routine x 2)     Status: None (Preliminary result)   Collection Time: 08/22/14  3:43 PM  Result Value Ref Range Status   Specimen Description BLOOD RIGHT FOREARM  Final   Special Requests BOTTLES DRAWN AEROBIC AND ANAEROBIC 5CC  Final   Culture   Final           BLOOD CULTURE RECEIVED NO GROWTH TO DATE CULTURE WILL BE HELD FOR 5 DAYS BEFORE ISSUING A FINAL NEGATIVE REPORT Performed at [facility]    Report Status PENDING  Incomplete  Blood Culture (routine x 2)     Status: None (Preliminary result)   Collection Time: 08/22/14  4:22 PM  Result Value Ref Range Status   Specimen Description BLOOD RIGHT HAND  Final   Special Requests BOTTLES DRAWN AEROBIC ONLY 3CC  Final   Culture   Final           BLOOD CULTURE RECEIVED NO GROWTH TO DATE CULTURE WILL BE HELD FOR 5  DAYS BEFORE ISSUING A FINAL NEGATIVE REPORT Performed at [facility]    Report Status PENDING  Incomplete  MRSA PCR Screening     Status: None   Collection Time: 08/22/14  6:56 PM  Result Value Ref Range Status   MRSA by PCR NEGATIVE NEGATIVE Final    Comment:        The GeneXpert MRSA Assay (FDA approved for NASAL specimens only), is one component of a comprehensive MRSA colonization surveillance program. It is not intended to diagnose MRSA infection nor to guide or monitor treatment for MRSA infections.      Studies: US Renal  08/23/2014   CLINICAL DATA:  Elevated BUN and creatinine. Hypertension, diabetes, renal disorder.  EXAM: RENAL / URINARY TRACT ULTRASOUND COMPLETE  COMPARISON:  MRI 07/26/2008  FINDINGS: Right Kidney:  Length: 11.5 cm. Echogenic renal parenchyma. No hydronephrosis or mass.  Left Kidney:  Length: 11.8 cm. Echogenic renal parenchyma. Mid pole cyst is 4.8 x 3.6 x 4.3 cm. No hydronephrosis or solid mass.  Bladder:  A Foley catheter decompresses the bladder.  IMPRESSION: 1. No hydronephrosis.  Echogenic renal parenchyma bilaterally. 2. Right renal cyst.   Electronically Signed   By: Norva Pavlov M.D.   On: 08/23/2014 14:20   Dg Swallowing Func-speech Pathology  08/24/2014    Objective Swallowing Evaluation:    Patient Details  Name: Andrew Dunn MRN: [id number] Date of Birth: 1925-07-09  Today's Date: 08/24/2014 Time: SLP Start Time (ACUTE ONLY): 1030-SLP Stop Time (ACUTE ONLY): 1100 SLP Time Calculation (min) (ACUTE ONLY): 30 min  Past Medical History:  Past Medical History  Diagnosis Date  . Hypertension   . Diabetes mellitus   . Anxiety   . Renal disorder   . Parkinson disease   . Lumbar radiculopathy   . Osteopenia   . CHF (congestive heart failure)    Past Surgical History:  Past Surgical History  Procedure Laterality Date  . Rotator cuff repair    . Left toenail removed    . Cholecystectomy     HPI:  Other Pertinent Information: 81 y.o. male, resident  of SNF,with history  of Parkinson's dementia, hypertension, type 2 diabetes mellitus, chronic  kidney disease stage III, history of CHF presented to the ED with sudden  onset hypoxia/ARF, sepsis secondary to UTI/pna.  Some coughing noted with  meals, hence swallow eval ordered.    No Data Recorded  Assessment / Plan / Recommendation CHL IP CLINICAL IMPRESSIONS 08/24/2014  Therapy Diagnosis Mild pharyngeal phase dysphagia  Clinical Impression Pt presents with a mlld oropharyngeal dysphagia marked  primarily by prolonged oral preparation of mechanical consistencies, mild  swallow delay, mod pharyngeal residue.  When consuming thin liquids in  isolation, pt demonstrates adequate airway protection with intermittent  high penetration underlying the epiglottis.  When drinking thins in  conjunction with eating solids, the solids collect in the valleculae, and  the thins are more likely to penetrate the larynx to the vocal cords.   Penetration elicits a protective cough, which ejects material from larynx.   No aspiration was observed.  Recommend resuming a regular diet with thin  liquids; meds should be crushed and given in puree given risk of lodging  in valleculae.  Pt should alternate solids/liquids to maximize safety.   SLP will f/u for education and diet toleration.       CHL IP TREATMENT RECOMMENDATION 08/24/2014  Treatment Recommendations Therapy as outlined in treatment plan below     CHL IP DIET RECOMMENDATION 08/24/2014  SLP Diet Recommendations Age appropriate regular solids;Thin  Liquid Administration via (None)  Medication Administration Crushed  with puree  Compensations Small sips/bites;Slow rate;Follow solids with liquid  Postural Changes and/or Swallow Maneuvers (None)     CHL IP OTHER RECOMMENDATIONS 08/24/2014  Recommended Consults (None)  Oral Care Recommendations Oral care BID  Other Recommendations (None)     No flowsheet data found.   CHL IP FREQUENCY AND DURATION 08/24/2014  Speech Therapy Frequency  (ACUTE ONLY) min 3x week  Treatment Duration 1 week         SLP Swallow Goals No flowsheet data found.  No flowsheet data found.    CHL IP REASON FOR REFERRAL 08/24/2014  Reason for Referral Objectively evaluate swallowing function     CHL IP ORAL PHASE 08/24/2014  Lips (None)  Tongue (None)  Mucous membranes (None)  Nutritional status (None)  Other (None)  Oxygen therapy (None)  Oral Phase Impaired  Oral - Pudding Teaspoon (None)  Oral - Pudding Cup (None)  Oral - Honey Teaspoon (None)  Oral - Honey Cup (None)  Oral - Honey Syringe (None)  Oral - Nectar Teaspoon (None)  Oral - Nectar Cup (None)  Oral - Nectar Straw (None)  Oral - Nectar Syringe (None)  Oral - Ice Chips (None)  Oral - Thin Teaspoon (None)  Oral - Thin Cup (None)  Oral - Thin Straw (None)  Oral - Thin Syringe (None)  Oral - Puree (None)  Oral - Mechanical Soft (None)  Oral - Regular (None)  Oral - Multi-consistency (None)  Oral - Pill (None)  Oral Phase - Comment (None)      CHL IP PHARYNGEAL PHASE 08/24/2014  Pharyngeal Phase Impaired  Pharyngeal - Pudding Teaspoon (None)  Penetration/Aspiration details (pudding teaspoon) (None)  Pharyngeal - Pudding Cup (None)  Penetration/Aspiration details (pudding cup) (None)  Pharyngeal - Honey Teaspoon (None)  Penetration/Aspiration details (honey teaspoon) (None)  Pharyngeal - Honey Cup (None)  Penetration/Aspiration details (honey cup) (None)  Pharyngeal - Honey Syringe (None)  Penetration/Aspiration details (honey syringe) (None)  Pharyngeal - Nectar Teaspoon (None)  Penetration/Aspiration details (nectar teaspoon) (None)  Pharyngeal - Nectar Cup (None)  Penetration/Aspiration details (nectar cup) (None)  Pharyngeal - Nectar Straw (None)  Penetration/Aspiration details (nectar straw) (None)  Pharyngeal - Nectar Syringe (None)  Penetration/Aspiration details (nectar syringe) (None)  Pharyngeal - Ice Chips (None)  Penetration/Aspiration details (ice chips) (None)  Pharyngeal - Thin Teaspoon (None)   Penetration/Aspiration details (thin teaspoon) (None)  Pharyngeal - Thin Cup (None)  Penetration/Aspiration details (thin cup) (None)  Pharyngeal - Thin Straw (None)  Penetration/Aspiration details (thin straw) (None)  Pharyngeal - Thin Syringe (None)  Penetration/Aspiration details (thin syringe') (None)  Pharyngeal - Puree (None)  Penetration/Aspiration details (puree) (None)  Pharyngeal - Mechanical Soft (None)  Penetration/Aspiration details (mechanical soft) (None)  Pharyngeal - Regular (None)  Penetration/Aspiration details (regular) (None)  Pharyngeal - Multi-consistency (None)  Penetration/Aspiration details (multi-consistency) (None)  Pharyngeal - Pill (None)  Penetration/Aspiration details (pill) (None)  Pharyngeal Comment high penetration thin liquids - elicits protective  cough.  No aspiration noted.      No flowsheet data found.  No flowsheet data found.        Amanda L. Samson Frederic, [place] CCC/SLP Pager [phone]  Blenda Mounts Laurice 08/24/2014, 11:55 AM     Scheduled Meds:  Scheduled Meds: . antiseptic oral rinse  7 mL Mouth Rinse BID  . aspirin EC  81 mg Oral Daily  . carbidopa-levodopa  1 tablet Oral TID  . ceFEPime (MAXIPIME) IV  1 g Intravenous Q24H  . doxazosin  8 mg Oral QHS  .  insulin aspart  0-20 Units Subcutaneous 6 times per day  . insulin glargine  20 Units Subcutaneous QHS  . methylPREDNISolone (SOLU-MEDROL) injection  60 mg Intravenous Q24H  . metoprolol tartrate  12.5 mg Oral BID  . mirtazapine  15 mg Oral QHS  . potassium chloride  40 mEq Oral Daily  . sertraline  50 mg Oral Daily  . sodium chloride  3 mL Intravenous Q12H  . vancomycin  1,000 mg Intravenous Q48H   Continuous Infusions: . diltiazem (CARDIZEM) infusion 5 mg/hr (08/25/14 0626)  . heparin 1,350 Units/hr (08/25/14 0454)    Time spent on care of this patient: 35 min   Katelan Hirt, MD 08/25/2014, 9:33 AM  LOS: 3 days   [facility] Office  [phone] Pager - Text Page per  [email or web address]  If 7PM-7AM, please contact night-coverage [email or web address]

## 2014-08-25 NOTE — Progress Notes (Signed)
Pt more confused and pulling at all lines.  PIV pulled out; Heparin paused for about 10 min.  Continuously pulling heart monitor leads off.  MD notified.  Will order Recruitment consultant.  Pt states, "I am taking this thing (foley) out one way or another.  It hurts and it's coming out!"  Blood seen in foley tubing.  Pt had pulled most of foley out and only 3cc in balloon.  Foreskin extremely swollen and pt c/o pain.  Foley d/c and MD notified.  Charge RN able to pull down foreskin over edematous part which gave great relief to patient.  Bladder Scan revealed 0 ml.  Will continue to monitor.

## 2014-08-26 ENCOUNTER — Inpatient Hospital Stay (HOSPITAL_COMMUNITY): Payer: Medicare Other

## 2014-08-26 DIAGNOSIS — G20 Parkinson's disease: Secondary | ICD-10-CM

## 2014-08-26 LAB — CBC WITH DIFFERENTIAL/PLATELET
BASOS ABS: 0.1 10*3/uL (ref 0.0–0.1)
BASOS PCT: 0 % (ref 0–1)
Eosinophils Absolute: 0 10*3/uL (ref 0.0–0.7)
Eosinophils Relative: 0 % (ref 0–5)
HCT: 30.5 % — ABNORMAL LOW (ref 39.0–52.0)
HEMOGLOBIN: 9.8 g/dL — AB (ref 13.0–17.0)
Lymphocytes Relative: 9 % — ABNORMAL LOW (ref 12–46)
Lymphs Abs: 1.5 10*3/uL (ref 0.7–4.0)
MCH: 27.2 pg (ref 26.0–34.0)
MCHC: 32.1 g/dL (ref 30.0–36.0)
MCV: 84.7 fL (ref 78.0–100.0)
MONO ABS: 1.6 10*3/uL — AB (ref 0.1–1.0)
MONOS PCT: 10 % (ref 3–12)
Neutro Abs: 12.7 10*3/uL — ABNORMAL HIGH (ref 1.7–7.7)
Neutrophils Relative %: 81 % — ABNORMAL HIGH (ref 43–77)
PLATELETS: 292 10*3/uL (ref 150–400)
RBC: 3.6 MIL/uL — AB (ref 4.22–5.81)
RDW: 14 % (ref 11.5–15.5)
WBC: 15.8 10*3/uL — ABNORMAL HIGH (ref 4.0–10.5)

## 2014-08-26 LAB — BASIC METABOLIC PANEL
ANION GAP: 7 (ref 5–15)
BUN: 94 mg/dL — ABNORMAL HIGH (ref 6–20)
CALCIUM: 8 mg/dL — AB (ref 8.9–10.3)
CHLORIDE: 107 mmol/L (ref 101–111)
CO2: 24 mmol/L (ref 22–32)
CREATININE: 2.35 mg/dL — AB (ref 0.61–1.24)
GFR calc Af Amer: 27 mL/min — ABNORMAL LOW (ref >60)
GFR calc non Af Amer: 23 mL/min — ABNORMAL LOW (ref >60)
GLUCOSE: 145 mg/dL — AB (ref 65–99)
Potassium: 5.6 mmol/L — ABNORMAL HIGH (ref 3.5–5.1)
Sodium: 138 mmol/L (ref 135–145)

## 2014-08-26 LAB — PROCALCITONIN: Procalcitonin: <0.1 ng/mL

## 2014-08-26 LAB — COMPREHENSIVE METABOLIC PANEL
ALBUMIN: 2.4 g/dL — AB (ref 3.5–5.0)
ALT: 8 U/L — AB (ref 17–63)
ANION GAP: 9 (ref 5–15)
AST: 23 U/L (ref 15–41)
Alkaline Phosphatase: 63 U/L (ref 38–126)
BUN: 94 mg/dL — ABNORMAL HIGH (ref 6–20)
CALCIUM: 8.1 mg/dL — AB (ref 8.9–10.3)
CO2: 25 mmol/L (ref 22–32)
Chloride: 103 mmol/L (ref 101–111)
Creatinine, Ser: 2.56 mg/dL — ABNORMAL HIGH (ref 0.61–1.24)
GFR calc non Af Amer: 21 mL/min — ABNORMAL LOW (ref >60)
GFR, EST AFRICAN AMERICAN: 25 mL/min — AB (ref >60)
Glucose, Bld: 149 mg/dL — ABNORMAL HIGH (ref 65–99)
Potassium: 5.3 mmol/L — ABNORMAL HIGH (ref 3.5–5.1)
Sodium: 137 mmol/L (ref 135–145)
Total Bilirubin: 0.4 mg/dL (ref 0.3–1.2)
Total Protein: 5.4 g/dL — ABNORMAL LOW (ref 6.5–8.1)

## 2014-08-26 LAB — GLUCOSE, CAPILLARY
GLUCOSE-CAPILLARY: 161 mg/dL — AB (ref 65–99)
Glucose-Capillary: 128 mg/dL — ABNORMAL HIGH (ref 65–99)
Glucose-Capillary: 147 mg/dL — ABNORMAL HIGH (ref 65–99)
Glucose-Capillary: 152 mg/dL — ABNORMAL HIGH (ref 65–99)
Glucose-Capillary: 165 mg/dL — ABNORMAL HIGH (ref 65–99)
Glucose-Capillary: 223 mg/dL — ABNORMAL HIGH (ref 65–99)

## 2014-08-26 LAB — HEPARIN LEVEL (UNFRACTIONATED): Heparin Unfractionated: 0.32 IU/mL (ref 0.30–0.70)

## 2014-08-26 LAB — MAGNESIUM: Magnesium: 2.6 mg/dL — ABNORMAL HIGH (ref 1.7–2.4)

## 2014-08-26 MED ORDER — LORAZEPAM 2 MG/ML IJ SOLN
0.5000 mg | Freq: Once | INTRAMUSCULAR | Status: AC
  Administered 2014-08-26: 0.5 mg via INTRAVENOUS
  Filled 2014-08-26: qty 1

## 2014-08-26 MED ORDER — ISOSORBIDE MONONITRATE ER 30 MG PO TB24
30.0000 mg | ORAL_TABLET | Freq: Every day | ORAL | Status: DC
  Administered 2014-08-26 – 2014-08-28 (×3): 30 mg via ORAL
  Filled 2014-08-26 (×3): qty 1

## 2014-08-26 MED ORDER — SODIUM POLYSTYRENE SULFONATE 15 GM/60ML PO SUSP
30.0000 g | Freq: Once | ORAL | Status: AC
  Administered 2014-08-26: 30 g via ORAL
  Filled 2014-08-26: qty 120

## 2014-08-26 MED ORDER — HEPARIN SODIUM (PORCINE) 5000 UNIT/ML IJ SOLN
5000.0000 [IU] | Freq: Three times a day (TID) | INTRAMUSCULAR | Status: DC
  Administered 2014-08-26 – 2014-08-28 (×6): 5000 [IU] via SUBCUTANEOUS
  Filled 2014-08-26 (×8): qty 1

## 2014-08-26 MED ORDER — ATORVASTATIN CALCIUM 80 MG PO TABS
80.0000 mg | ORAL_TABLET | Freq: Every day | ORAL | Status: DC
  Administered 2014-08-26 – 2014-08-27 (×2): 80 mg via ORAL
  Filled 2014-08-26: qty 2
  Filled 2014-08-26: qty 1

## 2014-08-26 MED ORDER — QUETIAPINE FUMARATE 25 MG PO TABS
25.0000 mg | ORAL_TABLET | Freq: Every day | ORAL | Status: DC
  Administered 2014-08-26 – 2014-08-27 (×2): 25 mg via ORAL
  Filled 2014-08-26 (×2): qty 1

## 2014-08-26 MED ORDER — HYDRALAZINE HCL 10 MG PO TABS
10.0000 mg | ORAL_TABLET | Freq: Three times a day (TID) | ORAL | Status: DC
  Administered 2014-08-26 – 2014-08-27 (×4): 10 mg via ORAL
  Filled 2014-08-26 (×4): qty 1

## 2014-08-26 NOTE — Progress Notes (Signed)
Late entry - Pt becoming increasingly confused and combative.  Cursing at staff and swinging upper extremities.  Pt also with multiple attempts to remove Intravenous therapy and telemetry.  Lenny Pastel, NP notified with new orders.  Pt currently resting with sitter at bedside.

## 2014-08-26 NOTE — Progress Notes (Signed)
[facility] Progress Note   Andrew Dunn EAV:[id number] DOB: 27-May-1925 DOA: 08/22/2014 PCP: Verneita Griffes, MD  Brief narrative: Andrew Dunn is a 81 y.o. male who resides in a nursing home with a past medical history of Parkinson's disease, dementia, hypertension, diabetes mellitus type 2, chronic kidney disease stage III, congestive heart failure who presented to the hospital with shortness of breath coughing and was found to be hypoxic and wheezing. There is a suspicion that he may have aspirated while he was eating as he suddenly developed coughing and wheezing and congestion after lunch. He had a temperature of 100.2 on the night prior to admission. Initial chest x-ray did not reveal any evidence of pneumonia. He was also noted to have a UTI. He had a Foley placed over the weekend prior to admission for urinary retention. He pulled the Foley out on the day that he was admitted. He was also noted to have acute renal failure and an elevated troponin of 1.11.    Subjective: Andrew Dunn noted to be increasingly confused yesterday, pulled his foley which resulted in hematuria. Foley re-inserted and hematuria noted to have resolved. Required Ativan overnight for agitation, currently has a sitter at bedside. He is alert but poorly communicative this AM.   Assessment/Plan: Principal Problem:   Sepsis - Suspected to be secondary to urinary tract infection and possible aspiration pneumonia -He was started on vancomycin and cefepime on 5/18  Active Problems: UTI possibly in relation to Foley catheter placed prior to admission -Urine culture growing Proteus which is sensitive to cephalosporins- transitioned to Keflex on 5/21-will complete a seven-day course- last dose on 4/24  Urinary retention -Continue Foley catheter and doxazosin  Possible aspiration pneumonia -He's had 2 chest x-rays since admission both of which have been free of infiltrate - he has some upper airway congestion  today - CXR repeated today continues to reveal clear lungs- will watch closely for further resp distress -Oxygen saturation 100% on 2L -Will discontinue vancomycin and cefepime as there are no signs of pneumonia either on CXR or clinically and respiratory status has improved significantly  Acute respiratory failure with hypoxia -Suspected to be either due to aspiration as mentioned above versus acute CHF exacerbation on admission  -Has been diuresed appropriately and also was given vancomycin and cefepime from 5/18- 5/21 - SLP eval performed on 5/20 reveals mild pharyngeal phase dysphagia but no overt aspiration   Acute systolic congestive heart failure  -2-D echo performed on 5/19 reveals an EF of 35% multiple areas of hypokinesis and akinesis-report mentioned below - IV diuretics given but currently on hold due to worsening renal function-continue to follow I and O's and daily weights -Respiratory status stable at this time -Cardiology assisting with management - added Imdur and Hydralazine today- unable to tolerate ACE I due to renal failure  NSTEMI -Cardiology is following-  patient has multiple comorbidities and at this time only medical management is appropriate for him -He was started on a heparin infusion, metoprolol, atorvastatin and aspirin- have discussed with cardiology- OK to stop Heparin infusion today per Dr Jens Som  New onset atrial fibrillation with RVR - CHA2DS2-VASc Score- 5 (age, HTN, CHF, DM) - Has converted to sinus rhythm and therefore Cardizem infusion is being discontinued -Continue metoprolol at 25 mg twice a day -Currently on a heparin infusion for anticoagulation- per cardiology, not a candidate for long term anticoagulation   Acute on chronic renal failure- chronic kidney disease stage III - Follow creatinine-dose diuretics appropriately-  diuretics currently on hold - BUN rising- CXR clear today therefore will continue to hold diuretics today -renal  ultrasound reveals echogenic renal parenchyma bilaterally without hydronephrosis  Hypokalemia -Likely in relation to diuretics-currently replaced adequately-continue to check and replace as needed  2 diabetes mellitus uncontrolled -A1c 8.3 -Continue Lantus and sliding scale insulin  Parkinson's disease -Continue Sinemet    Dementia - with behavioral disturbances- and increased agitation at night- start low dose Seroquel  Code Status: DO NOT RESUSCITATE Family Communication: have left Disposition Plan: SNF vs home with 24 hr supervision DVT prophylaxis: heparin infusion discontinued today- start Heparin s/c  Consultants:Cardiology Procedures: 2-D echo Left ventricle: Poor acoustic windows even with Definity use. Ovreall LVEF is approximately 35% with hypokinesis of the prox/mid inferior wall; akiensis of the distal inferior, distal anterior, distal septal and apex. The cavity size was mildly dilated. Wall thickness was increased in a pattern of mild LVH. - Aortic valve: AV is thickened, calcified with minimally restricted motion. - Mitral valve: There was mild regurgitation. - Left atrium: The atrium was mildly dilated. - Pulmonary arteries: PA peak pressure: 43 mm Hg (S).  Antibiotics: Anti-infectives    Start     Dose/Rate Route Frequency Ordered Stop   08/25/14 1730  vancomycin (VANCOCIN) IVPB 1000 mg/200 mL premix  Status:  Discontinued     1,000 mg 200 mL/hr over 60 Minutes Intravenous Every 48 hours 08/24/14 1005 08/25/14 1052   08/25/14 1200  cephALEXin (KEFLEX) 250 MG/5ML suspension 500 mg     500 mg Oral Every 12 hours 08/25/14 1153     08/25/14 1100  cephALEXin (KEFLEX) capsule 500 mg  Status:  Discontinued     500 mg Oral Every 12 hours 08/25/14 1052 08/25/14 1154   08/23/14 1730  vancomycin (VANCOCIN) IVPB 1000 mg/200 mL premix  Status:  Discontinued     1,000 mg 200 mL/hr over 60 Minutes Intravenous Every 24 hours 08/22/14 1915 08/24/14 1005    08/22/14 2200  ceFEPIme (MAXIPIME) 1 g in dextrose 5 % 50 mL IVPB  Status:  Discontinued     1 g 100 mL/hr over 30 Minutes Intravenous 3 times per day 08/22/14 1851 08/22/14 1921   08/22/14 2000  ceFEPIme (MAXIPIME) 1 g in dextrose 5 % 50 mL IVPB  Status:  Discontinued     1 g 100 mL/hr over 30 Minutes Intravenous Every 24 hours 08/22/14 1921 08/25/14 1052   08/22/14 1545  vancomycin (VANCOCIN) 1,660 mg in sodium chloride 0.9 % 500 mL IVPB     20 mg/kg  83 kg (Order-Specific) 250 mL/hr over 120 Minutes Intravenous  Once 08/22/14 1542 08/22/14 1936   08/22/14 1545  piperacillin-tazobactam (ZOSYN) IVPB 3.375 g     3.375 g 100 mL/hr over 30 Minutes Intravenous  Once 08/22/14 1542 08/22/14 1708      Objective: Filed Weights   08/24/14 0500 08/25/14 0454 08/26/14 0535  Weight: 91.128 kg (200 lb 14.4 oz) 88.542 kg (195 lb 3.2 oz) 90.719 kg (200 lb)    Intake/Output Summary (Last 24 hours) at 08/26/14 0939 Last data filed at 08/26/14 0926  Gross per 24 hour  Intake 539.43 ml  Output   1025 ml  Net -485.57 ml     Vitals Filed Vitals:   08/25/14 1317 08/25/14 2058 08/26/14 0535 08/26/14 0753  BP: 143/63 174/86 166/77 157/61  Pulse: 68 74 72 74  Temp: 99 F (37.2 C) 97.5 F (36.4 C) 98.2 F (36.8 C) 97.8 F (36.6 C)  TempSrc:  Oral Oral Oral   Resp: 21 20 22 20   Height:      Weight:   90.719 kg (200 lb)   SpO2: 95% 96% 90% 100%    Exam:  General:  Pt is alert, confused, not in acute distress  HEENT: No icterus, No thrush  Cardiovascular: regular rate and rhythm, S1/S2 No murmur  Respiratory: upper air way congestion noted- no resp distress, no crackles  Abdomen: Soft, +Bowel sounds, non tender, non distended, no guarding  MSK:  2 + LE edema- no cyanosis or clubbing  Data Reviewed: Basic Metabolic Panel:  Recent Labs Lab 08/22/14 1924 08/23/14 0909 08/23/14 1207 08/24/14 0112 08/25/14 0538 08/26/14 0459  NA 139 139 140 138 135 137  K 3.5 3.6 3.6 3.9 4.8  5.3*  CL 99* 104 104 104 101 103  CO2 27 25 25 26 25 25   GLUCOSE 295* 190* 190* 152* 296* 149*  BUN 41* 45* 46* 50* 66* 94*  CREATININE 2.14* 2.31* 2.33* 2.73* 2.63* 2.56*  CALCIUM 7.8* 8.0* 7.7* 8.3* 8.1* 8.1*  MG 1.7  --  2.6* 2.5* 2.7* 2.6*   Liver Function Tests:  Recent Labs Lab 08/22/14 1924 08/23/14 1207 08/24/14 0112 08/25/14 0538 08/26/14 0459  AST 28 27 30 18 23   ALT 14* 5* 6* 8* 8*  ALKPHOS 65 59 66 73 63  BILITOT 1.0 0.6 0.4 0.3 0.4  PROT 6.1* 5.3* 6.1* 5.9* 5.4*  ALBUMIN 2.6* 2.1* 2.3* 2.2* 2.4*   No results for input(s): LIPASE, AMYLASE in the last 168 hours. No results for input(s): AMMONIA in the last 168 hours. CBC:  Recent Labs Lab 08/22/14 1546 08/23/14 0909 08/24/14 0112 08/25/14 0538 08/26/14 0459  WBC 16.2* 13.5* 12.5* 13.2* 15.8*  NEUTROABS  --   --  11.2* 11.8* 12.7*  HGB 11.3* 10.2* 10.4* 10.8* 9.8*  HCT 35.2* 31.3* 32.6* 33.5* 30.5*  MCV 85.9 85.3 85.3 85.0 84.7  PLT 200 184 208 246 292   Cardiac Enzymes:  Recent Labs Lab 08/22/14 1924 08/23/14 0909 08/23/14 1207 08/23/14 1929 08/24/14 0117  TROPONINI 2.74* 4.26* 4.44* 3.66* 3.27*   BNP (last 3 results)  Recent Labs  08/22/14 1546  BNP 772.4*    ProBNP (last 3 results) No results for input(s): PROBNP in the last 8760 hours.  CBG:  Recent Labs Lab 08/25/14 1617 08/25/14 2027 08/26/14 0015 08/26/14 0417 08/26/14 0720  GLUCAP 225* 218* 165* 147* 161*    Recent Results (from the past 240 hour(s))  Urine culture     Status: None   Collection Time: 08/22/14  3:28 PM  Result Value Ref Range Status   Specimen Description URINE, CATHETERIZED  Final   Special Requests NONE  Final   Colony Count   Final    >=100,000 COLONIES/ML Performed at [facility]    Culture   Final    PROTEUS MIRABILIS Performed at [facility]    Report Status 08/24/2014 FINAL  Final   Organism ID, Bacteria PROTEUS MIRABILIS  Final      Susceptibility   Proteus  mirabilis - MIC*    AMPICILLIN >=32 RESISTANT Resistant     CEFAZOLIN <=1 SENSITIVE Sensitive     CEFTRIAXONE <=1 SENSITIVE Sensitive     CIPROFLOXACIN >=4 RESISTANT Resistant     GENTAMICIN <=1 SENSITIVE Sensitive     LEVOFLOXACIN >=8 RESISTANT Resistant     NITROFURANTOIN 128 RESISTANT Resistant     TOBRAMYCIN <=1 SENSITIVE Sensitive     TRIMETH/SULFA >=320 RESISTANT  Resistant     PIP/TAZO RESISTANT      * PROTEUS MIRABILIS  Blood Culture (routine x 2)     Status: None (Preliminary result)   Collection Time: 08/22/14  3:43 PM  Result Value Ref Range Status   Specimen Description BLOOD RIGHT FOREARM  Final   Special Requests BOTTLES DRAWN AEROBIC AND ANAEROBIC 5CC  Final   Culture   Final           BLOOD CULTURE RECEIVED NO GROWTH TO DATE CULTURE WILL BE HELD FOR 5 DAYS BEFORE ISSUING A FINAL NEGATIVE REPORT Performed at [facility]    Report Status PENDING  Incomplete  Blood Culture (routine x 2)     Status: None (Preliminary result)   Collection Time: 08/22/14  4:22 PM  Result Value Ref Range Status   Specimen Description BLOOD RIGHT HAND  Final   Special Requests BOTTLES DRAWN AEROBIC ONLY 3CC  Final   Culture   Final           BLOOD CULTURE RECEIVED NO GROWTH TO DATE CULTURE WILL BE HELD FOR 5 DAYS BEFORE ISSUING A FINAL NEGATIVE REPORT Performed at [facility]    Report Status PENDING  Incomplete  MRSA PCR Screening     Status: None   Collection Time: 08/22/14  6:56 PM  Result Value Ref Range Status   MRSA by PCR NEGATIVE NEGATIVE Final    Comment:        The GeneXpert MRSA Assay (FDA approved for NASAL specimens only), is one component of a comprehensive MRSA colonization surveillance program. It is not intended to diagnose MRSA infection nor to guide or monitor treatment for MRSA infections.      Studies: Dg Chest Port 1 View  08/26/2014   CLINICAL DATA:  Acute MI. Acute respiratory failure. Sepsis. Acute systolic CHF. Acute superimposed  upon chronic kidney disease. Followup interstitial edema.  EXAM: PORTABLE CHEST - 1 VIEW  COMPARISON:  Portable chest x-rays 08/23/2014, 08/22/2014. Two-view chest x-ray 05/17/2011 and earlier.  FINDINGS: Cardiac silhouette moderately enlarged for AP portable technique, unchanged. Interval improvement in interstitial pulmonary edema since examination 3 days ago, with only minimal interstitial edema persisting. No new pulmonary parenchymal abnormalities. Chronic elevation the right hemidiaphragm with chronic scar/atelectasis at the right lung base.  IMPRESSION: 1. Interval improvement in interstitial edema since the examination 3 days ago, with only minimal interstitial edema persisting. 2. No new abnormalities.   Electronically Signed   By: Hulan Saas M.D.   On: 08/26/2014 09:10   Dg Swallowing Func-speech Pathology  08/24/2014    Objective Swallowing Evaluation:    Patient Details  Name: Andrew Dunn MRN: [id number] Date of Birth: 02/22/1926  Today's Date: 08/24/2014 Time: SLP Start Time (ACUTE ONLY): 1030-SLP Stop Time (ACUTE ONLY): 1100 SLP Time Calculation (min) (ACUTE ONLY): 30 min  Past Medical History:  Past Medical History  Diagnosis Date  . Hypertension   . Diabetes mellitus   . Anxiety   . Renal disorder   . Parkinson disease   . Lumbar radiculopathy   . Osteopenia   . CHF (congestive heart failure)    Past Surgical History:  Past Surgical History  Procedure Laterality Date  . Rotator cuff repair    . Left toenail removed    . Cholecystectomy     HPI:  Other Pertinent Information: 81 y.o. male, resident of SNF,with history  of Parkinson's dementia, hypertension, type 2 diabetes mellitus, chronic  kidney disease stage III, history  of CHF presented to the ED with sudden  onset hypoxia/ARF, sepsis secondary to UTI/pna.  Some coughing noted with  meals, hence swallow eval ordered.    No Data Recorded  Assessment / Plan / Recommendation CHL IP CLINICAL IMPRESSIONS 08/24/2014  Therapy Diagnosis Mild  pharyngeal phase dysphagia  Clinical Impression Pt presents with a mlld oropharyngeal dysphagia marked  primarily by prolonged oral preparation of mechanical consistencies, mild  swallow delay, mod pharyngeal residue.  When consuming thin liquids in  isolation, pt demonstrates adequate airway protection with intermittent  high penetration underlying the epiglottis.  When drinking thins in  conjunction with eating solids, the solids collect in the valleculae, and  the thins are more likely to penetrate the larynx to the vocal cords.   Penetration elicits a protective cough, which ejects material from larynx.   No aspiration was observed.  Recommend resuming a regular diet with thin  liquids; meds should be crushed and given in puree given risk of lodging  in valleculae.  Pt should alternate solids/liquids to maximize safety.   SLP will f/u for education and diet toleration.       CHL IP TREATMENT RECOMMENDATION 08/24/2014  Treatment Recommendations Therapy as outlined in treatment plan below     CHL IP DIET RECOMMENDATION 08/24/2014  SLP Diet Recommendations Age appropriate regular solids;Thin  Liquid Administration via (None)  Medication Administration Crushed with puree  Compensations Small sips/bites;Slow rate;Follow solids with liquid  Postural Changes and/or Swallow Maneuvers (None)     CHL IP OTHER RECOMMENDATIONS 08/24/2014  Recommended Consults (None)  Oral Care Recommendations Oral care BID  Other Recommendations (None)     No flowsheet data found.   CHL IP FREQUENCY AND DURATION 08/24/2014  Speech Therapy Frequency (ACUTE ONLY) min 3x week  Treatment Duration 1 week         SLP Swallow Goals No flowsheet data found.  No flowsheet data found.    CHL IP REASON FOR REFERRAL 08/24/2014  Reason for Referral Objectively evaluate swallowing function     CHL IP ORAL PHASE 08/24/2014  Lips (None)  Tongue (None)  Mucous membranes (None)  Nutritional status (None)  Other (None)  Oxygen therapy (None)  Oral Phase Impaired   Oral - Pudding Teaspoon (None)  Oral - Pudding Cup (None)  Oral - Honey Teaspoon (None)  Oral - Honey Cup (None)  Oral - Honey Syringe (None)  Oral - Nectar Teaspoon (None)  Oral - Nectar Cup (None)  Oral - Nectar Straw (None)  Oral - Nectar Syringe (None)  Oral - Ice Chips (None)  Oral - Thin Teaspoon (None)  Oral - Thin Cup (None)  Oral - Thin Straw (None)  Oral - Thin Syringe (None)  Oral - Puree (None)  Oral - Mechanical Soft (None)  Oral - Regular (None)  Oral - Multi-consistency (None)  Oral - Pill (None)  Oral Phase - Comment (None)      CHL IP PHARYNGEAL PHASE 08/24/2014  Pharyngeal Phase Impaired  Pharyngeal - Pudding Teaspoon (None)  Penetration/Aspiration details (pudding teaspoon) (None)  Pharyngeal - Pudding Cup (None)  Penetration/Aspiration details (pudding cup) (None)  Pharyngeal - Honey Teaspoon (None)  Penetration/Aspiration details (honey teaspoon) (None)  Pharyngeal - Honey Cup (None)  Penetration/Aspiration details (honey cup) (None)  Pharyngeal - Honey Syringe (None)  Penetration/Aspiration details (honey syringe) (None)  Pharyngeal - Nectar Teaspoon (None)  Penetration/Aspiration details (nectar teaspoon) (None)  Pharyngeal - Nectar Cup (None)  Penetration/Aspiration details (nectar cup) (None)  Pharyngeal - Nectar Straw (  None)  Penetration/Aspiration details (nectar straw) (None)  Pharyngeal - Nectar Syringe (None)  Penetration/Aspiration details (nectar syringe) (None)  Pharyngeal - Ice Chips (None)  Penetration/Aspiration details (ice chips) (None)  Pharyngeal - Thin Teaspoon (None)  Penetration/Aspiration details (thin teaspoon) (None)  Pharyngeal - Thin Cup (None)  Penetration/Aspiration details (thin cup) (None)  Pharyngeal - Thin Straw (None)  Penetration/Aspiration details (thin straw) (None)  Pharyngeal - Thin Syringe (None)  Penetration/Aspiration details (thin syringe') (None)  Pharyngeal - Puree (None)  Penetration/Aspiration details (puree) (None)  Pharyngeal - Mechanical Soft  (None)  Penetration/Aspiration details (mechanical soft) (None)  Pharyngeal - Regular (None)  Penetration/Aspiration details (regular) (None)  Pharyngeal - Multi-consistency (None)  Penetration/Aspiration details (multi-consistency) (None)  Pharyngeal - Pill (None)  Penetration/Aspiration details (pill) (None)  Pharyngeal Comment high penetration thin liquids - elicits protective  cough.  No aspiration noted.      No flowsheet data found.  No flowsheet data found.        Amanda L. Samson Frederic, [place] CCC/SLP Pager [phone]  Blenda Mounts Laurice 08/24/2014, 11:55 AM     Scheduled Meds:  Scheduled Meds: . antiseptic oral rinse  7 mL Mouth Rinse BID  . aspirin EC  81 mg Oral Daily  . atorvastatin  80 mg Oral q1800  . carbidopa-levodopa  1 tablet Oral TID  . cephALEXin  500 mg Oral Q12H  . doxazosin  8 mg Oral QHS  . heparin subcutaneous  5,000 Units Subcutaneous 3 times per day  . hydrALAZINE  10 mg Oral 3 times per day  . insulin aspart  0-20 Units Subcutaneous 6 times per day  . insulin glargine  20 Units Subcutaneous QHS  . isosorbide mononitrate  30 mg Oral Daily  . metoprolol tartrate  25 mg Oral BID  . mirtazapine  15 mg Oral QHS  . potassium chloride  40 mEq Oral Daily  . sertraline  50 mg Oral Daily  . sodium chloride  3 mL Intravenous Q12H   Continuous Infusions:    Time spent on care of this patient: 35 min   Sutton Hirsch, MD 08/26/2014, 9:39 AM  LOS: 4 days   [facility] Office  [phone] Pager - Text Page per [email or web address]  If 7PM-7AM, please contact night-coverage [email or web address]

## 2014-08-26 NOTE — Progress Notes (Addendum)
Patient Name: Andrew Dunn Date of Encounter: 08/26/2014   Length of Stay: 4  SUBJECTIVE  The patient denies chest pain or SOB; confused; A and O x 1.  CURRENT MEDS . antiseptic oral rinse  7 mL Mouth Rinse BID  . aspirin EC  81 mg Oral Daily  . carbidopa-levodopa  1 tablet Oral TID  . cephALEXin  500 mg Oral Q12H  . doxazosin  8 mg Oral QHS  . insulin aspart  0-20 Units Subcutaneous 6 times per day  . insulin glargine  20 Units Subcutaneous QHS  . metoprolol tartrate  25 mg Oral BID  . mirtazapine  15 mg Oral QHS  . potassium chloride  40 mEq Oral Daily  . sertraline  50 mg Oral Daily  . sodium chloride  3 mL Intravenous Q12H   . heparin 1,350 Units/hr (08/25/14 0627)    OBJECTIVE  Filed Vitals:   08/25/14 1317 08/25/14 2058 08/26/14 0535 08/26/14 0753  BP: 143/63 174/86 166/77 157/61  Pulse: 68 74 72 74  Temp: 99 F (37.2 C) 97.5 F (36.4 C) 98.2 F (36.8 C) 97.8 F (36.6 C)  TempSrc: Oral Oral Oral   Resp: 21 20 22 20   Height:      Weight:   200 lb (90.719 kg)   SpO2: 95% 96% 90%     Intake/Output Summary (Last 24 hours) at 08/26/14 0808 Last data filed at 08/26/14 0200  Gross per 24 hour  Intake 659.43 ml  Output    575 ml  Net  84.43 ml   Filed Weights   08/24/14 0500 08/25/14 0454 08/26/14 0535  Weight: 200 lb 14.4 oz (91.128 kg) 195 lb 3.2 oz (88.542 kg) 200 lb (90.719 kg)    PHYSICAL EXAM  General: Pleasant, confused, NAD. Neuro: Alert and oriented X 1. Moves all extremities spontaneously. HEENT:  Normal  Neck: Supple  Lungs:  Mild exp wheeze Heart: irregular Abdomen: Soft, non-tender, non-distended Extremities: 1-2+edema.   Accessory Clinical Findings  CBC  Recent Labs  08/25/14 0538 08/26/14 0459  WBC 13.2* 15.8*  NEUTROABS 11.8* 12.7*  HGB 10.8* 9.8*  HCT 33.5* 30.5*  MCV 85.0 84.7  PLT 246 292   Basic Metabolic Panel  Recent Labs  08/25/14 0538 08/26/14 0459  NA 135 137  K 4.8 5.3*  CL 101 103  CO2 25 25    GLUCOSE 296* 149*  BUN 66* 94*  CREATININE 2.63* 2.56*  CALCIUM 8.1* 8.1*  MG 2.7* 2.6*   Liver Function Tests  Recent Labs  08/25/14 0538 08/26/14 0459  AST 18 23  ALT 8* 8*  ALKPHOS 73 63  BILITOT 0.3 0.4  PROT 5.9* 5.4*  ALBUMIN 2.2* 2.4*   Cardiac Enzymes  Recent Labs  08/23/14 1207 08/23/14 1929 08/24/14 0117  TROPONINI 4.44* 3.66* 3.27*   Radiology/Studies  Dg Chest Port 1 View  08/23/2014   CLINICAL DATA:  Shortness of breath, elevated right hemidiaphragm.   IMPRESSION: Slight interval worsening in the appearance of the pulmonary interstitium suggesting low-grade edema secondary to CHF. There is right basilar atelectasis.     Dg Chest Port 1 View  08/22/2014   CLINICAL DATA:  Shortness of breath.  Diabetes.  IMPRESSION: 1. Mild enlargement of the cardiopericardial silhouette, without edema. 2. Stable elevation of the right hemidiaphragm, with mild atelectasis at the right lung base. 3. Atherosclerotic aortic arch.     TELE: SR with pacs     ASSESSMENT AND PLAN  1. Acute  respiratory failure most likely secondary to aspiration +/- CHF and Acute MI. He has diffuse inspiratory and expiratory wheezes on exam.Would repeat chest xray.   2. Sepsis most likely secondary to UTI +/- PNA - antibiotics per primary care  3. NSTEMI-Echo shows EF 35, mild MR and mild LAE. Possible contribution from atrial fibrillation with RVR. Considering patient's underlying dementia, stage IV kidney function that is worsening we will treat conservatively. Not a candidate for cath. Continue aspirin and beta blocker. It has now been 4 days since presentation. Discontinue heparin and treat with DVT prophylaxis dose. Add hydralazine/nitrates given reduced LV function. No ACE inhibitor given renal insufficiency. Add statin. Patient appears to be volume overloaded but renal function continues to deteriorate. Hold diuretics and follow. Prognosis appears to be poor.  4. Acute on chronic  renal insufficiency stage III. We will hold diuretics.   5. Acute systolic CHF-renal function continues to deteriorate. Hold diuretics and follow.  6. New onset a-fib with RVR - now in sinus rhythm. Continue beta blocker. Patient is not a candidate for long-term anticoagulation. CHADSvasc 6.    Signed, Olga Millers MD, [facility] 08/26/2014

## 2014-08-26 NOTE — Progress Notes (Signed)
Upon assessment this am, Andrew Dunn appeared tachypniec, resp rate in the 30s, sats in the 90s on 2lNC. Inspiratory and exp wheezes with fine crackles audible. Dr. Butler Denmark on the floor and came to assess Andrew Dunn. Orders for CXR received and Dr. Butler Denmark felt like it was upper respiratory congestion. Will continue to monitor and follow through with orders

## 2014-08-26 NOTE — Progress Notes (Signed)
Pharmacist Heart Failure Core Measure Documentation  Assessment: Andrew Dunn has an EF documented as 35% on 08/23/14.  Rationale: Heart failure patients with left ventricular systolic dysfunction (LVSD) and an EF < 40% should be prescribed an angiotensin converting enzyme inhibitor (ACEI) or angiotensin receptor blocker (ARB) at discharge unless a contraindication is documented in the medical record.  This patient is not currently on an ACEI or ARB for HF.  This note is being placed in the record in order to provide documentation that a contraindication to the use of these agents is present for this encounter.  ACE Inhibitor or Angiotensin Receptor Blocker is contraindicated (specify all that apply)  []   ACEI allergy AND ARB allergy []   Angioedema []   Moderate or severe aortic stenosis []   Hyperkalemia []   Hypotension []   Renal artery stenosis [x]   Worsening renal function, preexisting renal disease or dysfunction   [name], Drake Leach 08/26/2014 1:13 PM

## 2014-08-26 NOTE — Progress Notes (Signed)
Pt refused for afternoon BMet to be drawn. Became combative, swinging at lab tech. We agreed that I would call them back when pt more cooperative. I called lab dept at 1715 to inform them that pt agreeable to redraw lab. Awaiting lab to come back to floor

## 2014-08-27 ENCOUNTER — Inpatient Hospital Stay (HOSPITAL_COMMUNITY): Payer: Medicare Other

## 2014-08-27 DIAGNOSIS — E875 Hyperkalemia: Secondary | ICD-10-CM

## 2014-08-27 DIAGNOSIS — I4891 Unspecified atrial fibrillation: Secondary | ICD-10-CM

## 2014-08-27 DIAGNOSIS — D72829 Elevated white blood cell count, unspecified: Secondary | ICD-10-CM

## 2014-08-27 LAB — GLUCOSE, CAPILLARY
Glucose-Capillary: 153 mg/dL — ABNORMAL HIGH (ref 65–99)
Glucose-Capillary: 157 mg/dL — ABNORMAL HIGH (ref 65–99)
Glucose-Capillary: 171 mg/dL — ABNORMAL HIGH (ref 65–99)
Glucose-Capillary: 200 mg/dL — ABNORMAL HIGH (ref 65–99)
Glucose-Capillary: 210 mg/dL — ABNORMAL HIGH (ref 65–99)
Glucose-Capillary: 97 mg/dL (ref 65–99)

## 2014-08-27 LAB — COMPREHENSIVE METABOLIC PANEL
ALK PHOS: 57 U/L (ref 38–126)
ALT: 6 U/L — AB (ref 17–63)
ANION GAP: 9 (ref 5–15)
AST: 46 U/L — AB (ref 15–41)
Albumin: 2.3 g/dL — ABNORMAL LOW (ref 3.5–5.0)
BILIRUBIN TOTAL: 0.6 mg/dL (ref 0.3–1.2)
BUN: 89 mg/dL — ABNORMAL HIGH (ref 6–20)
CHLORIDE: 107 mmol/L (ref 101–111)
CO2: 24 mmol/L (ref 22–32)
Calcium: 8.1 mg/dL — ABNORMAL LOW (ref 8.9–10.3)
Creatinine, Ser: 2.36 mg/dL — ABNORMAL HIGH (ref 0.61–1.24)
GFR, EST AFRICAN AMERICAN: 27 mL/min — AB (ref >60)
GFR, EST NON AFRICAN AMERICAN: 23 mL/min — AB (ref >60)
GLUCOSE: 144 mg/dL — AB (ref 65–99)
Potassium: 5.6 mmol/L — ABNORMAL HIGH (ref 3.5–5.1)
SODIUM: 140 mmol/L (ref 135–145)
Total Protein: 5.3 g/dL — ABNORMAL LOW (ref 6.5–8.1)

## 2014-08-27 LAB — CBC WITH DIFFERENTIAL/PLATELET
BASOS ABS: 0.2 10*3/uL — AB (ref 0.0–0.1)
Basophils Relative: 1 % (ref 0–1)
Eosinophils Absolute: 0.3 10*3/uL (ref 0.0–0.7)
Eosinophils Relative: 2 % (ref 0–5)
HCT: 29.7 % — ABNORMAL LOW (ref 39.0–52.0)
HEMOGLOBIN: 9.6 g/dL — AB (ref 13.0–17.0)
Lymphocytes Relative: 10 % — ABNORMAL LOW (ref 12–46)
Lymphs Abs: 1.7 10*3/uL (ref 0.7–4.0)
MCH: 27.6 pg (ref 26.0–34.0)
MCHC: 32.3 g/dL (ref 30.0–36.0)
MCV: 85.3 fL (ref 78.0–100.0)
MONOS PCT: 11 % (ref 3–12)
Monocytes Absolute: 1.9 10*3/uL — ABNORMAL HIGH (ref 0.1–1.0)
Neutro Abs: 13.1 10*3/uL — ABNORMAL HIGH (ref 1.7–7.7)
Neutrophils Relative %: 76 % (ref 43–77)
Platelets: 285 10*3/uL (ref 150–400)
RBC: 3.48 MIL/uL — ABNORMAL LOW (ref 4.22–5.81)
RDW: 14.4 % (ref 11.5–15.5)
WBC: 17.2 10*3/uL — ABNORMAL HIGH (ref 4.0–10.5)

## 2014-08-27 LAB — BASIC METABOLIC PANEL
Anion gap: 8 (ref 5–15)
BUN: 91 mg/dL — AB (ref 6–20)
CALCIUM: 8 mg/dL — AB (ref 8.9–10.3)
CHLORIDE: 108 mmol/L (ref 101–111)
CO2: 25 mmol/L (ref 22–32)
Creatinine, Ser: 2.38 mg/dL — ABNORMAL HIGH (ref 0.61–1.24)
GFR calc Af Amer: 27 mL/min — ABNORMAL LOW (ref >60)
GFR, EST NON AFRICAN AMERICAN: 23 mL/min — AB (ref >60)
Glucose, Bld: 192 mg/dL — ABNORMAL HIGH (ref 65–99)
Potassium: 5.1 mmol/L (ref 3.5–5.1)
SODIUM: 141 mmol/L (ref 135–145)

## 2014-08-27 LAB — T4, FREE: FREE T4: 1.05 ng/dL (ref 0.61–1.12)

## 2014-08-27 LAB — MAGNESIUM: Magnesium: 2.5 mg/dL — ABNORMAL HIGH (ref 1.7–2.4)

## 2014-08-27 MED ORDER — METOPROLOL TARTRATE 50 MG PO TABS
50.0000 mg | ORAL_TABLET | Freq: Two times a day (BID) | ORAL | Status: DC
  Administered 2014-08-27 – 2014-08-28 (×2): 50 mg via ORAL
  Filled 2014-08-27 (×2): qty 1

## 2014-08-27 MED ORDER — METOPROLOL TARTRATE 25 MG PO TABS
25.0000 mg | ORAL_TABLET | Freq: Once | ORAL | Status: AC
  Administered 2014-08-27: 25 mg via ORAL

## 2014-08-27 MED ORDER — DEXTROSE 5 % IV SOLN
5.0000 mg/h | INTRAVENOUS | Status: DC
  Administered 2014-08-27: 5 mg/h via INTRAVENOUS
  Filled 2014-08-27: qty 100

## 2014-08-27 MED ORDER — SODIUM CHLORIDE 0.9 % IV SOLN
1.0000 g | Freq: Once | INTRAVENOUS | Status: AC
  Administered 2014-08-27: 1 g via INTRAVENOUS
  Filled 2014-08-27: qty 10

## 2014-08-27 MED ORDER — HYDRALAZINE HCL 25 MG PO TABS
25.0000 mg | ORAL_TABLET | Freq: Three times a day (TID) | ORAL | Status: DC
  Administered 2014-08-27 – 2014-08-28 (×3): 25 mg via ORAL
  Filled 2014-08-27 (×3): qty 1

## 2014-08-27 MED ORDER — SODIUM CHLORIDE 0.9 % IV SOLN
1.5000 g | Freq: Two times a day (BID) | INTRAVENOUS | Status: DC
  Administered 2014-08-27: 1.5 g via INTRAVENOUS
  Filled 2014-08-27 (×2): qty 1.5

## 2014-08-27 MED ORDER — SODIUM POLYSTYRENE SULFONATE 15 GM/60ML PO SUSP
30.0000 g | Freq: Once | ORAL | Status: AC
  Administered 2014-08-27: 30 g via ORAL
  Filled 2014-08-27: qty 120

## 2014-08-27 MED ORDER — AMIODARONE HCL 200 MG PO TABS
400.0000 mg | ORAL_TABLET | Freq: Two times a day (BID) | ORAL | Status: DC
  Administered 2014-08-27 – 2014-08-28 (×3): 400 mg via ORAL
  Filled 2014-08-27 (×3): qty 2

## 2014-08-27 NOTE — Progress Notes (Signed)
[facility] Progress Note   DWUAN GUSMAN WKM:[id number] DOB: 06-Jan-1926 DOA: 08/22/2014 PCP: Verneita Griffes, MD  Brief narrative: Andrew LIVERS is a 81 y.o. male who resides in a nursing home with a past medical history of Parkinson's disease, dementia, hypertension, diabetes mellitus type 2, chronic kidney disease stage III, congestive heart failure who presented to the hospital with shortness of breath coughing and was found to be hypoxic and wheezing. There is a suspicion that he may have aspirated while he was eating as he suddenly developed coughing and wheezing and congestion after lunch. He had a temperature of 100.2 on the night prior to admission. Initial chest x-ray did not reveal any evidence of pneumonia. He was also noted to have a UTI. He had a Foley placed over the weekend prior to admission for urinary retention. He pulled the Foley out on the day that he was admitted. He was also noted to have acute renal failure and an elevated troponin of 1.11. Cardiology has been following and assisting in management for acute MI and paroxysmal atrial fibrillation. Respiratory status has improved with diuresis. He has been significantly confused and intermittently agitated throughout the hospital stay.   Subjective: Andrew Dunn developed A. fib with RVR and a Cardizem infusion was started yesterday. Currently he is calm but remains confused and has no complaints.  Assessment/Plan: Principal Problem:   Sepsis - Suspected to be secondary to urinary tract infection and possible aspiration pneumonia -He was started on vancomycin and cefepime on 5/18  Active Problems: UTI possibly in relation to Foley catheter placed prior to admission -Urine culture growing Proteus which is sensitive to cephalosporins- transitioned to Keflex on 5/21-will complete a seven-day course- last dose on 4/24  Urinary retention -Continue Foley catheter and doxazosin  New onset atrial fibrillation with  RVR - CHA2DS2-VASc Score- 5 (age, HTN, CHF, DM) -  converted to sinus rhythm and therefore Cardizem infusion discontinued -Unfortunately went back into A. fib with RVR yesterday-Cardizem infusion had to be resumed-cardiology starting amiodarone today -Continue metoprolol at 25 mg twice a day - On aspirin 81 mg for long-term anticoagulation-per cardiology, not a candidate for stronger anticoagulants  Possible aspiration pneumonia -He's had 2 chest x-rays since admission both of which have been free of infiltrate - he has some upper airway congestion today - CXR repeated today continues to reveal clear lungs- will watch closely for further resp distress -Oxygen saturation 100% on 2L -vancomycin and cefepime discontinued after last dose on 5/21  Leukocytosis - WBC count noted to be steadily rising-no signs of infection-his respirations are increased today and therefore I ordered a repeat chest x-ray today which continues to be negative for pneumonia once again- -Continue to follow WBC count  Acute respiratory failure with hypoxia -Suspected to be either due to aspiration as mentioned above versus acute CHF exacerbation on admission  -Has been diuresed appropriately and also was given vancomycin and cefepime from 5/18- 5/21 - SLP eval performed on 5/20 reveals mild pharyngeal phase dysphagia but no overt aspiration   Acute systolic congestive heart failure  -2-D echo performed on 5/19 reveals an EF of 35% multiple areas of hypokinesis and akinesis-report mentioned below - IV diuretics given but currently on hold due to worsening renal function-continue to follow I and O's and daily weights -Respiratory status stable at this time -Cardiology assisting with management - added Imdur and Hydralazine today- unable to tolerate ACE I due to renal failure  NSTEMI -Cardiology is following-  patient has  multiple comorbidities and at this time only medical management is appropriate for him -He was  started on a heparin infusion, metoprolol, atorvastatin and aspirin- have discussed with cardiology- OK to stop Heparin infusion today per Dr Jens Som  Acute on chronic renal failure- chronic kidney disease stage 4 - Follow creatinine-dose diuretics appropriately- diuretics currently on hold - BUN rising- CXR clear today therefore will continue to hold diuretics today -renal ultrasound reveals echogenic renal parenchyma bilaterally without hydronephrosis  Hypokalemia/hyperkalemia -Likely in relation to diuretics and was replaced with IV and oral potassium- has become slightly hyperkalemic and has received 2 doses of Kayexalate-continue to follow   2 diabetes mellitus uncontrolled -A1c 8.3 -Continue Lantus and sliding scale insulin  Parkinson's disease -Continue Sinemet    Dementia - with behavioral disturbances- and increased agitation at night- started low dose Seroquel at bedtime  Code Status: DO NOT RESUSCITATE Family Communication:Spoke with the patient's daughter yesterday  Disposition Plan: SNFwhen stable  DVT prophylaxis: Heparin s/c  Consultants:Cardiology Procedures: 2-D echo Left ventricle: Poor acoustic windows even with Definity use. Ovreall LVEF is approximately 35% with hypokinesis of the prox/mid inferior wall; akiensis of the distal inferior, distal anterior, distal septal and apex. The cavity size was mildly dilated. Wall thickness was increased in a pattern of mild LVH. - Aortic valve: AV is thickened, calcified with minimally restricted motion. - Mitral valve: There was mild regurgitation. - Left atrium: The atrium was mildly dilated. - Pulmonary arteries: PA peak pressure: 43 mm Hg (S).  Antibiotics: Anti-infectives    Start     Dose/Rate Route Frequency Ordered Stop   08/27/14 0800  ampicillin-sulbactam (UNASYN) 1.5 g in sodium chloride 0.9 % 50 mL IVPB     1.5 g 100 mL/hr over 30 Minutes Intravenous Every 12 hours 08/27/14 0721     08/25/14  1730  vancomycin (VANCOCIN) IVPB 1000 mg/200 mL premix  Status:  Discontinued     1,000 mg 200 mL/hr over 60 Minutes Intravenous Every 48 hours 08/24/14 1005 08/25/14 1052   08/25/14 1200  cephALEXin (KEFLEX) 250 MG/5ML suspension 500 mg     500 mg Oral Every 12 hours 08/25/14 1153     08/25/14 1100  cephALEXin (KEFLEX) capsule 500 mg  Status:  Discontinued     500 mg Oral Every 12 hours 08/25/14 1052 08/25/14 1154   08/23/14 1730  vancomycin (VANCOCIN) IVPB 1000 mg/200 mL premix  Status:  Discontinued     1,000 mg 200 mL/hr over 60 Minutes Intravenous Every 24 hours 08/22/14 1915 08/24/14 1005   08/22/14 2200  ceFEPIme (MAXIPIME) 1 g in dextrose 5 % 50 mL IVPB  Status:  Discontinued     1 g 100 mL/hr over 30 Minutes Intravenous 3 times per day 08/22/14 1851 08/22/14 1921   08/22/14 2000  ceFEPIme (MAXIPIME) 1 g in dextrose 5 % 50 mL IVPB  Status:  Discontinued     1 g 100 mL/hr over 30 Minutes Intravenous Every 24 hours 08/22/14 1921 08/25/14 1052   08/22/14 1545  vancomycin (VANCOCIN) 1,660 mg in sodium chloride 0.9 % 500 mL IVPB     20 mg/kg  83 kg (Order-Specific) 250 mL/hr over 120 Minutes Intravenous  Once 08/22/14 1542 08/22/14 1936   08/22/14 1545  piperacillin-tazobactam (ZOSYN) IVPB 3.375 g     3.375 g 100 mL/hr over 30 Minutes Intravenous  Once 08/22/14 1542 08/22/14 1708      Objective: Filed Weights   08/25/14 0454 08/26/14 0535 08/27/14 0450  Weight: 88.542  kg (195 lb 3.2 oz) 90.719 kg (200 lb) 91.853 kg (202 lb 8 oz)    Intake/Output Summary (Last 24 hours) at 08/27/14 1300 Last data filed at 08/27/14 1215  Gross per 24 hour  Intake    180 ml  Output   2000 ml  Net  -1820 ml     Vitals Filed Vitals:   08/26/14 2134 08/27/14 0450 08/27/14 0847 08/27/14 1110  BP: 151/58 123/62 133/58 113/52  Pulse: 106 115    Temp: 97.8 F (36.6 C) 98.1 F (36.7 C) 98.2 F (36.8 C) 98 F (36.7 C)  TempSrc: Axillary Axillary Axillary Axillary  Resp: 24  34 35  Height:       Weight:  91.853 kg (202 lb 8 oz)    SpO2: 98% 93% 95% 99%    Exam:  General:  Pt is alert, confused, not in acute distress  HEENT: No icterus, No thrush  Cardiovascular: regular rate and rhythm, S1/S2 No murmur  Respiratory: upper air way congestion noted- no resp distress, no crackles  Abdomen: Soft, +Bowel sounds, non tender, non distended, no guarding  MSK:  2 + LE edema- no cyanosis or clubbing  Data Reviewed: Basic Metabolic Panel:  Recent Labs Lab 08/23/14 1207 08/24/14 0112 08/25/14 0538 08/26/14 0459 08/26/14 1858 08/27/14 0305  NA 140 138 135 137 138 140  K 3.6 3.9 4.8 5.3* 5.6* 5.6*  CL 104 104 101 103 107 107  CO2 GLUCOSE 190* 152* 296* 149* 145* 144*  BUN 46* 50* 66* 94* 94* 89*  CREATININE 2.33* 2.73* 2.63* 2.56* 2.35* 2.36*  CALCIUM 7.7* 8.3* 8.1* 8.1* 8.0* 8.1*  MG 2.6* 2.5* 2.7* 2.6*  --  2.5*   Dunn Function Tests:  Recent Labs Lab 08/23/14 1207 08/24/14 0112 08/25/14 0538 08/26/14 0459 08/27/14 0305  AST 46*  ALT 5* 6* 8* 8* 6*  ALKPHOS 59 66 73 63 57  BILITOT 0.6 0.4 0.3 0.4 0.6  PROT 5.3* 6.1* 5.9* 5.4* 5.3*  ALBUMIN 2.1* 2.3* 2.2* 2.4* 2.3*   No results for input(s): LIPASE, AMYLASE in the last 168 hours. No results for input(s): AMMONIA in the last 168 hours. CBC:  Recent Labs Lab 08/23/14 0909 08/24/14 0112 08/25/14 0538 08/26/14 0459 08/27/14 0305  WBC 13.5* 12.5* 13.2* 15.8* 17.2*  NEUTROABS  --  11.2* 11.8* 12.7* 13.1*  HGB 10.2* 10.4* 10.8* 9.8* 9.6*  HCT 31.3* 32.6* 33.5* 30.5* 29.7*  MCV 85.3 85.3 85.0 84.7 85.3  PLT 184 208 246 292 285   Cardiac Enzymes:  Recent Labs Lab 08/22/14 1924 08/23/14 0909 08/23/14 1207 08/23/14 1929 08/24/14 0117  TROPONINI 2.74* 4.26* 4.44* 3.66* 3.27*   BNP (last 3 results)  Recent Labs  08/22/14 1546  BNP 772.4*    ProBNP (last 3 results) No results for input(s): PROBNP in the last 8760 hours.  CBG:  Recent Labs Lab  08/26/14 1959 08/27/14 0048 08/27/14 0622 08/27/14 0724 08/27/14 1108  GLUCAP 128* 97 153* 171* 157*    Recent Results (from the past 240 hour(s))  Urine culture     Status: None   Collection Time: 08/22/14  3:28 PM  Result Value Ref Range Status   Specimen Description URINE, CATHETERIZED  Final   Special Requests NONE  Final   Colony Count   Final    >=100,000 COLONIES/ML Performed at [facility]    Culture   Final    PROTEUS MIRABILIS Performed  at [facility]    Report Status 08/24/2014 FINAL  Final   Organism ID, Bacteria PROTEUS MIRABILIS  Final      Susceptibility   Proteus mirabilis - MIC*    AMPICILLIN >=32 RESISTANT Resistant     CEFAZOLIN <=1 SENSITIVE Sensitive     CEFTRIAXONE <=1 SENSITIVE Sensitive     CIPROFLOXACIN >=4 RESISTANT Resistant     GENTAMICIN <=1 SENSITIVE Sensitive     LEVOFLOXACIN >=8 RESISTANT Resistant     NITROFURANTOIN 128 RESISTANT Resistant     TOBRAMYCIN <=1 SENSITIVE Sensitive     TRIMETH/SULFA >=320 RESISTANT Resistant     PIP/TAZO RESISTANT      * PROTEUS MIRABILIS  Blood Culture (routine x 2)     Status: None (Preliminary result)   Collection Time: 08/22/14  3:43 PM  Result Value Ref Range Status   Specimen Description BLOOD RIGHT FOREARM  Final   Special Requests BOTTLES DRAWN AEROBIC AND ANAEROBIC 5CC  Final   Culture   Final           BLOOD CULTURE RECEIVED NO GROWTH TO DATE CULTURE WILL BE HELD FOR 5 DAYS BEFORE ISSUING A FINAL NEGATIVE REPORT Performed at [facility]    Report Status PENDING  Incomplete  Blood Culture (routine x 2)     Status: None (Preliminary result)   Collection Time: 08/22/14  4:22 PM  Result Value Ref Range Status   Specimen Description BLOOD RIGHT HAND  Final   Special Requests BOTTLES DRAWN AEROBIC ONLY 3CC  Final   Culture   Final           BLOOD CULTURE RECEIVED NO GROWTH TO DATE CULTURE WILL BE HELD FOR 5 DAYS BEFORE ISSUING A FINAL NEGATIVE REPORT Performed at  [facility]    Report Status PENDING  Incomplete  MRSA PCR Screening     Status: None   Collection Time: 08/22/14  6:56 PM  Result Value Ref Range Status   MRSA by PCR NEGATIVE NEGATIVE Final    Comment:        The GeneXpert MRSA Assay (FDA approved for NASAL specimens only), is one component of a comprehensive MRSA colonization surveillance program. It is not intended to diagnose MRSA infection nor to guide or monitor treatment for MRSA infections.      Studies: Dg Chest Port 1 View  08/27/2014   CLINICAL DATA:  Respiratory distress  EXAM: PORTABLE CHEST - 1 VIEW  COMPARISON:  08/26/2014  FINDINGS: Persistent low lung volumes with central vascular congestion and scattered bibasilar atelectasis. No significant change in aeration. No enlarging effusion or pneumothorax. Trachea midline. Atherosclerosis of the aorta.  IMPRESSION: Stable low lung volumes with vascular congestion and scattered atelectasis.   Electronically Signed   By: Judie Petit.  Shick M.D.   On: 08/27/2014 09:25   Dg Chest Port 1 View  08/26/2014   CLINICAL DATA:  Acute MI. Acute respiratory failure. Sepsis. Acute systolic CHF. Acute superimposed upon chronic kidney disease. Followup interstitial edema.  EXAM: PORTABLE CHEST - 1 VIEW  COMPARISON:  Portable chest x-rays 08/23/2014, 08/22/2014. Two-view chest x-ray 05/17/2011 and earlier.  FINDINGS: Cardiac silhouette moderately enlarged for AP portable technique, unchanged. Interval improvement in interstitial pulmonary edema since examination 3 days ago, with only minimal interstitial edema persisting. No new pulmonary parenchymal abnormalities. Chronic elevation the right hemidiaphragm with chronic scar/atelectasis at the right lung base.  IMPRESSION: 1. Interval improvement in interstitial edema since the examination 3 days ago, with only minimal  interstitial edema persisting. 2. No new abnormalities.   Electronically Signed   By: Hulan Saas M.D.   On: 08/26/2014 09:10     Scheduled Meds:  Scheduled Meds: . amiodarone  400 mg Oral BID  . ampicillin-sulbactam (UNASYN) IV  1.5 g Intravenous Q12H  . antiseptic oral rinse  7 mL Mouth Rinse BID  . aspirin EC  81 mg Oral Daily  . atorvastatin  80 mg Oral q1800  . carbidopa-levodopa  1 tablet Oral TID  . cephALEXin  500 mg Oral Q12H  . doxazosin  8 mg Oral QHS  . heparin subcutaneous  5,000 Units Subcutaneous 3 times per day  . hydrALAZINE  25 mg Oral 3 times per day  . insulin aspart  0-20 Units Subcutaneous 6 times per day  . insulin glargine  20 Units Subcutaneous QHS  . isosorbide mononitrate  30 mg Oral Daily  . metoprolol tartrate  50 mg Oral BID  . mirtazapine  15 mg Oral QHS  . QUEtiapine  25 mg Oral QHS  . sertraline  50 mg Oral Daily  . sodium chloride  3 mL Intravenous Q12H   Continuous Infusions: . diltiazem (CARDIZEM) infusion Stopped (08/27/14 1130)    Time spent on care of this patient: 35 min   Coby Shrewsberry, MD 08/27/2014, 1:00 PM  LOS: 5 days   [facility] Office  [phone] Pager - Text Page per [email or web address]  If 7PM-7AM, please contact night-coverage [email or web address]

## 2014-08-27 NOTE — Progress Notes (Signed)
Speech Language Pathology Dysphagia Treatment Patient Details Name: VALDIS BEVILL MRN: [id number] DOB: 02-13-1926 Today's Date: 08/27/2014 Time: 5217-4715 SLP Time Calculation (min) (ACUTE ONLY): 16 min  Assessment / Plan / Recommendation Clinical Impression   Diagnostic theraputic PO trials administered to assess diet tolerance. PO trials of regular solids and thin liquids provided with no overt s/s of aspiration. Use of compensatory strategies to increase safety of swallow facitilitated with mod A, verbal and tactile cues, including ST presenting liquids after solid swallow. Pt SOB, educated pt to take breaks in between bites and during meals to support coordination of deglutitive apnea. Pt will continue to require supervision and assistance during meals to carryover swallow strategies due to cognitive impairment.     Diet Recommendation   Regular solids/thin liquids    SLP Plan All goals met D/C ST at this time  Pertinent Vitals/Pain No pain reported   Swallowing Goals    Patient will utilize recommended strategies during swallow to increase swallowing safety with assistance/supervision  Fully upright Small bites Alternate solids and liquids Due to SOB, take breaks during meals  General Behavior/Cognition: Alert;Cooperative;Confused Patient Positioning: Upright in bed Other Pertinent Information: 81 y.o. male, resident of SNF,with history of Parkinson's dementia, hypertension, type 2 diabetes mellitus, chronic kidney disease stage III, history of CHF presented to the ED with sudden onset hypoxia/ARF, sepsis secondary to UTI/pna.  Some coughing noted with meals, hence swallow eval ordered.    Oral Cavity - Oral Hygiene   Oral care BID  Dysphagia Treatment Feeding: Able to feed self   GO     Lovvorn, Annye Rusk MS, CCC-SLP 08/27/2014, 1:32 PM

## 2014-08-27 NOTE — Care Management Note (Addendum)
Case Management Note  Patient Details  Name: Andrew Dunn MRN: [id number] Date of Birth: 08-08-1925  Subjective/Objective:  Pt admitted for SOB and A Fib.                   Action/Plan: Plan for SNF once medically stable for d/c. N o further needs from CM.               Expected Discharge Plan:  Skilled Nursing Facility  In-House Referral:  Clinical Social Work  Medicare Important Message given? YES   (If response is "NO", the following Medicare IM given date fields will be blank)   Date Medicare IM given:  08-27-14 Medicare IM given by: Tomi Bamberger    Additional CommentsGala Lewandowsky, RN 08/27/2014, 3:55 PM

## 2014-08-27 NOTE — Progress Notes (Signed)
81yo male admitted 5/18 for sepsis, now concern for aspiration PNA, to begin Unasyn.  Will start Unasyn 1.5g IV Q12H for CrCl ~63ml/min and monitor CBC and Cx.  Vernard Gambles, PharmD, BCPS 08/27/2014 7:21 AM

## 2014-08-27 NOTE — Clinical Social Work Note (Signed)
CSW contacted [facility] SNF to update regarding disposition.  [facility] agreeable to admission upon medical discharge.  Vickii Penna, LCSW [phone]  Psychiatric, Orthopedics (5N 1-8), covering 3W 10-13 Clinical Social Worker

## 2014-08-27 NOTE — Progress Notes (Signed)
Patient noted to be in AFib with rates in the 120s. Asymptomatic. On call MD aware. New orders for cardizem drip given. Will continue to monitor.

## 2014-08-27 NOTE — Progress Notes (Signed)
Subjective: He reports no breathing difficulties but he appears to be working hard at it.   Objective: Vital signs in last 24 hours: Temp:  [97.8 F (36.6 C)-98.8 F (37.1 C)] 98.1 F (36.7 C) (05/23 0450) Pulse Rate:  [74-115] 115 (05/23 0450) Resp:  [20-24] 24 (05/22 2134) BP: (123-151)/(55-67) 123/62 mmHg (05/23 0450) SpO2:  [93 %-100 %] 93 % (05/23 0450) Weight:  [202 lb 8 oz (91.853 kg)] 202 lb 8 oz (91.853 kg) (05/23 0450) Last BM Date: 08/26/14  Intake/Output from previous day: 05/22 0701 - 05/23 0700 In: 300 [P.O.:300] Out: 2150 [Urine:2150] Intake/Output this shift:    Medications Scheduled Meds: . ampicillin-sulbactam (UNASYN) IV  1.5 g Intravenous Q12H  . antiseptic oral rinse  7 mL Mouth Rinse BID  . aspirin EC  81 mg Oral Daily  . atorvastatin  80 mg Oral q1800  . carbidopa-levodopa  1 tablet Oral TID  . cephALEXin  500 mg Oral Q12H  . doxazosin  8 mg Oral QHS  . heparin subcutaneous  5,000 Units Subcutaneous 3 times per day  . hydrALAZINE  10 mg Oral 3 times per day  . insulin aspart  0-20 Units Subcutaneous 6 times per day  . insulin glargine  20 Units Subcutaneous QHS  . isosorbide mononitrate  30 mg Oral Daily  . metoprolol tartrate  25 mg Oral BID  . mirtazapine  15 mg Oral QHS  . QUEtiapine  25 mg Oral QHS  . sertraline  50 mg Oral Daily  . sodium chloride  3 mL Intravenous Q12H   Continuous Infusions: . diltiazem (CARDIZEM) infusion 10 mg/hr (08/27/14 0322)   PRN Meds:.sodium chloride, acetaminophen, levalbuterol, nitroGLYCERIN, ondansetron (ZOFRAN) IV, sodium chloride, sorbitol  PE: General appearance: alert, cooperative and no distress Lungs: Bilateral rales and exp wheeze Heart: irregularly irregular rhythm Abdomen: +BS.  Diffusely tender.  Extremities: 1+ LEE Pulses: 2+ and symmetric Skin: Warm and dry Neurologic: Grossly normal  Lab Results:   Recent Labs  08/25/14 0538 08/26/14 0459 08/27/14 0305  WBC 13.2* 15.8* 17.2*    HGB 10.8* 9.8* 9.6*  HCT 33.5* 30.5* 29.7*  PLT 246 292 285   BMET  Recent Labs  08/26/14 0459 08/26/14 1858 08/27/14 0305  NA 137 138 140  K 5.3* 5.6* 5.6*  CL 103 107 107  CO2 GLUCOSE 149* 145* 144*  BUN 94* 94* 89*  CREATININE 2.56* 2.35* 2.36*  CALCIUM 8.1* 8.0* 8.1*    Assessment/Plan  Principal Problem:   Sepsis Active Problems:   Dementia   Diabetes mellitus   Hypokalemia   Parkinson's disease   Pernicious anemia   Osteopenia   Acute respiratory failure with hypoxia   Acute exacerbation of CHF (congestive heart failure)   HCAP (healthcare-associated pneumonia)   Elevated troponin   UTI (lower urinary tract infection)   Acute renal failure superimposed on stage 3 chronic kidney disease   Leukocytosis   Abnormal EKG   Urinary retention   Sepsis secondary to UTI   Aspiration pneumonia   Heart attack   Atrial fibrillation with RVR   Acute systolic CHF (congestive heart failure)   Acute on chronic renal failure   Diabetes type 2, uncontrolled  1. Acute respiratory failure  Most likely secondary to aspiration +/- CHF and Acute MI. CXR shows improvement in interstitial edema with minimal persisting.    2. Sepsis  Most likely secondary to UTI +/- PNA - antibiotics per primary care.  WBCs increased.  3. NSTEMI- Echo shows EF 35, mild MR and mild LAE. Possible contribution from atrial fibrillation with RVR. Considering patient's underlying dementia, stage IV kidney function that is worsening we will treat conservatively. Not a candidate for cath. Continue aspirin and beta blocker.  Now on DVT dose heparin.  On hydralazine/nitrates given reduced LV function. No ACE inhibitor given renal insufficiency.  Statin.  Prognosis appears to be poor.  4. Acute on chronic renal insufficiency stage III.   Stable today.  Diuretic DCd yesterday.     5. Acute systolic CHF- Net fluids: -1.9L/-0.1L.   renal function elevated but stable.  CXR yesterday  shows improvement in interstitial edema with minimal persisting.  Concerns for aspirations this morning.   Swallow eval pending.   6. New onset a-fib with RVR -  Back in Afrib.  Cardizem was started.  Will increase beta blocker to 50 BID and wean off cardizem as EF is poor.  Patient is not a candidate for long-term anticoagulation. CHADSvasc 6.    LOS: 5 days    HAGER, BRYAN PA-C 08/27/2014 8:29 AM  As above, patient seen and examined. He denies dyspnea or chest pain. As outlined previously he is not a candidate for cardiac catheterization. Plan medical therapy for recent non-ST elevation myocardial infarction. Continue aspirin, statin and beta blocker. Increase hydralazine to 25 mg by mouth 3 times a day and continue nitrates. No ACE inhibitor given renal insufficiency. We will continue to hold diuretics given severity of prerenal azotemia. He has converted back to atrial fibrillation. I will add amiodarone 400 mg by mouth twice a day. Increase metoprolol to 50 mg by mouth twice a day. We will wean Cardizem off when able given reduced LV function. I do not think he is a long-term anticoagulation candidate. Prognosis appears to be poor. Olga Millers

## 2014-08-27 NOTE — Progress Notes (Signed)
Physical Therapy Treatment Patient Details Name: Andrew Dunn MRN: [id number] DOB: 02/25/1926 Today's Date: 08/27/2014    History of Present Illness 81 y.o. WM PMHx Parkinson's dementia, hypertension, type 2 diabetes mellitus, chronic kidney disease stage III, history of CHF, remote tobacco history quit in 2009, [facility].    PT Comments    Attempting to work with patient EOB. Patient able to move LEs off bed with some hesitation and assistance from PTA and rehab tech however, when attempting to elevated trunk and assist with sitting forward patient became increasingly agitated and demanded to lie back down despite encouragement to work further. Continue to recommend SNF for ongoing Physical Therapy.     Follow Up Recommendations  SNF;Supervision/Assistance - 24 hour     Equipment Recommendations  Other (comment) (TBD)    Recommendations for Other Services       Precautions / Restrictions Precautions Precautions: Fall    Mobility  Bed Mobility Overal bed mobility: Needs Assistance;+2 for physical assistance Bed Mobility: Rolling Rolling: Max assist;+2 for physical assistance         General bed mobility comments: Patient rolled with max assist for initiation and was able to positoin LEs off EOB with assistance however when attempting to lift trunk up off of bed patient becoming increasingly agitated and returned to full supine.   Transfers                    Ambulation/Gait                 Stairs            Wheelchair Mobility    Modified Rankin (Stroke Patients Only)       Balance                                    Cognition Arousal/Alertness: Lethargic Behavior During Therapy: Flat affect;Agitated Overall Cognitive Status: Difficult to assess                      Exercises      General Comments        Pertinent Vitals/Pain Pain Assessment: No/denies pain    Home Living                       Prior Function            PT Goals (current goals can now be found in the care plan section) Progress towards PT goals: Not progressing toward goals - comment    Frequency  Min 2X/week (trial)    PT Plan Current plan remains appropriate    Co-evaluation             End of Session   Activity Tolerance: Other (comment) (agitation) Patient left: in bed;with call bell/phone within reach;with bed alarm set     Time: 4332-9518 PT Time Calculation (min) (ACUTE ONLY): 14 min  Charges:  $Therapeutic Activity: 8-22 mins                    G Codes:      Fredrich Birks 08/27/2014, 2:39 PM 08/27/2014 Fredrich Birks PTA [phone] pager [phone] office

## 2014-08-28 DIAGNOSIS — I214 Non-ST elevation (NSTEMI) myocardial infarction: Secondary | ICD-10-CM

## 2014-08-28 DIAGNOSIS — N390 Urinary tract infection, site not specified: Secondary | ICD-10-CM

## 2014-08-28 LAB — CULTURE, BLOOD (ROUTINE X 2): Culture: NO GROWTH

## 2014-08-28 LAB — BASIC METABOLIC PANEL
Anion gap: 8 (ref 5–15)
BUN: 97 mg/dL — ABNORMAL HIGH (ref 6–20)
CO2: 25 mmol/L (ref 22–32)
Calcium: 8.4 mg/dL — ABNORMAL LOW (ref 8.9–10.3)
Chloride: 109 mmol/L (ref 101–111)
Creatinine, Ser: 2.43 mg/dL — ABNORMAL HIGH (ref 0.61–1.24)
GFR calc Af Amer: 26 mL/min — ABNORMAL LOW (ref >60)
GFR, EST NON AFRICAN AMERICAN: 23 mL/min — AB (ref >60)
GLUCOSE: 233 mg/dL — AB (ref 65–99)
Potassium: 5.1 mmol/L (ref 3.5–5.1)
Sodium: 142 mmol/L (ref 135–145)

## 2014-08-28 LAB — CBC
HCT: 27 % — ABNORMAL LOW (ref 39.0–52.0)
Hemoglobin: 8.4 g/dL — ABNORMAL LOW (ref 13.0–17.0)
MCH: 27.3 pg (ref 26.0–34.0)
MCHC: 31.1 g/dL (ref 30.0–36.0)
MCV: 87.7 fL (ref 78.0–100.0)
Platelets: 272 10*3/uL (ref 150–400)
RBC: 3.08 MIL/uL — ABNORMAL LOW (ref 4.22–5.81)
RDW: 14.9 % (ref 11.5–15.5)
WBC: 17 10*3/uL — AB (ref 4.0–10.5)

## 2014-08-28 LAB — MAGNESIUM: Magnesium: 2.7 mg/dL — ABNORMAL HIGH (ref 1.7–2.4)

## 2014-08-28 LAB — GLUCOSE, CAPILLARY
GLUCOSE-CAPILLARY: 200 mg/dL — AB (ref 65–99)
Glucose-Capillary: 147 mg/dL — ABNORMAL HIGH (ref 65–99)
Glucose-Capillary: 175 mg/dL — ABNORMAL HIGH (ref 65–99)
Glucose-Capillary: 238 mg/dL — ABNORMAL HIGH (ref 65–99)

## 2014-08-28 MED ORDER — HYDRALAZINE HCL 25 MG PO TABS: 25.0000 mg | ORAL_TABLET | Freq: Three times a day (TID) | ORAL | Status: AC

## 2014-08-28 MED ORDER — ATORVASTATIN CALCIUM 80 MG PO TABS: 80.0000 mg | ORAL_TABLET | Freq: Every day | ORAL | Status: AC

## 2014-08-28 MED ORDER — INSULIN ASPART 100 UNIT/ML ~~LOC~~ SOLN: 0.0000 [IU] | Freq: Three times a day (TID) | SUBCUTANEOUS | Status: AC

## 2014-08-28 MED ORDER — ISOSORBIDE MONONITRATE ER 30 MG PO TB24: 30.0000 mg | ORAL_TABLET | Freq: Every day | ORAL | Status: AC

## 2014-08-28 MED ORDER — METOPROLOL TARTRATE 50 MG PO TABS: 50.0000 mg | ORAL_TABLET | Freq: Two times a day (BID) | ORAL | Status: AC

## 2014-08-28 MED ORDER — LANTUS 100 UNIT/ML ~~LOC~~ SOLN: 20.0000 [IU] | Freq: Every day | SUBCUTANEOUS | Status: AC

## 2014-08-28 MED ORDER — CEPHALEXIN 250 MG/5ML PO SUSR: 500.0000 mg | Freq: Once | ORAL | Status: DC

## 2014-08-28 MED ORDER — AMIODARONE HCL 400 MG PO TABS: 400.0000 mg | ORAL_TABLET | Freq: Two times a day (BID) | ORAL | Status: AC

## 2014-08-28 MED ORDER — QUETIAPINE FUMARATE 25 MG PO TABS: 25.0000 mg | ORAL_TABLET | Freq: Every day | ORAL | Status: AC

## 2014-08-28 NOTE — Progress Notes (Signed)
Subjective: Some SOB.  Uncooperative  Objective: Vital signs in last 24 hours: Temp:  [97.8 F (36.6 C)-98.7 F (37.1 C)] 97.9 F (36.6 C) (05/24 0815) Pulse Rate:  [68-77] 77 (05/24 0815) Resp:  [18-35] 18 (05/24 0815) BP: (110-140)/(40-83) 140/83 mmHg (05/24 0815) SpO2:  [93 %-99 %] 95 % (05/24 0815) Weight:  [204 lb 4.8 oz (92.67 kg)] 204 lb 4.8 oz (92.67 kg) (05/24 0411) Last BM Date: 08/27/14  Intake/Output from previous day: 05/23 0701 - 05/24 0700 In: 260 [P.O.:260] Out: 920 [Urine:920] Intake/Output this shift:    Medications Scheduled Meds: . amiodarone  400 mg Oral BID  . antiseptic oral rinse  7 mL Mouth Rinse BID  . aspirin EC  81 mg Oral Daily  . atorvastatin  80 mg Oral q1800  . carbidopa-levodopa  1 tablet Oral TID  . cephALEXin  500 mg Oral Q12H  . doxazosin  8 mg Oral QHS  . heparin subcutaneous  5,000 Units Subcutaneous 3 times per day  . hydrALAZINE  25 mg Oral 3 times per day  . insulin aspart  0-20 Units Subcutaneous 6 times per day  . insulin glargine  20 Units Subcutaneous QHS  . isosorbide mononitrate  30 mg Oral Daily  . metoprolol tartrate  50 mg Oral BID  . mirtazapine  15 mg Oral QHS  . QUEtiapine  25 mg Oral QHS  . sertraline  50 mg Oral Daily  . sodium chloride  3 mL Intravenous Q12H   Continuous Infusions: . diltiazem (CARDIZEM) infusion Stopped (08/27/14 1130)   PRN Meds:.sodium chloride, acetaminophen, levalbuterol, nitroGLYCERIN, ondansetron (ZOFRAN) IV, sodium chloride, sorbitol  PE: General appearance: alert, no distress and uncooperative Lungs: clear to auscultation bilaterally and Patient refused to lean forward. Heart: irregularly irregular rhythm Abdomen: tender,  tense Extremities: 1+ LEE Pulses: 2+ and symmetric Skin: Warm and dry Neurologic: Grossly normal  Lab Results:   Recent Labs  08/26/14 0459 08/27/14 0305 08/28/14 0404  WBC 15.8* 17.2* 17.0*  HGB 9.8* 9.6* 8.4*  HCT 30.5* 29.7* 27.0*  PLT 292  285 272   BMET  Recent Labs  08/27/14 0305 08/27/14 1428 08/28/14 0404  NA 140 141 142  K 5.6* 5.1 5.1  CL 107 108 109  CO2 24 25 25   GLUCOSE 144* 192* 233*  BUN 89* 91* 97*  CREATININE 2.36* 2.38* 2.43*  CALCIUM 8.1* 8.0* 8.4*   PT/INR No results for input(s): LABPROT, INR in the last 72 hours. Cholesterol No results for input(s): CHOL in the last 72 hours. Cardiac Enzymes Invalid input(s): TROPONIN,  CKMB  Studies/Results: @RISRSLT2 @   Assessment/Plan   Principal Problem:   Sepsis Active Problems:   Dementia   Diabetes mellitus   Hypokalemia   Parkinson's disease   Pernicious anemia   Osteopenia   Acute respiratory failure with hypoxia   Acute exacerbation of CHF (congestive heart failure)   HCAP (healthcare-associated pneumonia)   Elevated troponin   UTI (lower urinary tract infection)   Acute renal failure superimposed on stage 3 chronic kidney disease   Leukocytosis   Abnormal EKG   Urinary retention   Sepsis secondary to UTI   Aspiration pneumonia   Heart attack   Atrial fibrillation with RVR   Acute systolic CHF (congestive heart failure)   Acute on chronic renal failure   Diabetes type 2, uncontrolled   Hyperkalemia  1. Acute respiratory failure  Most likely secondary to aspiration +/- CHF and Acute MI. CXR shows improvement in interstitial  edema with minimal persisting.  Swallow eval negative.   2. Sepsis  Most likely secondary to UTI +/- PNA - antibiotics per primary care. WBCs increased.   3. NSTEMI- Echo shows EF 35, mild MR and mild LAE. Possible contribution from atrial fibrillation with RVR. Considering patient's underlying dementia, stage IV kidney function that is worsening we will treat conservatively. Not a candidate for cath. Continue aspirin and beta blocker. Now on DVT dose heparin. On hydralazine/nitrates given reduced LV function. No ACE inhibitor given renal insufficiency. Statin.  Prognosis appears to be  poor.  4. Acute on chronic renal insufficiency stage III.  Increased today. Diuretic DCd two days ago.   5. Acute systolic CHF- Net fluids: -0.7L/-7L.SCr/BUN going up.  CXR yesterday shows vascular congestion and stable lung/ volumes.  Continue to hold lasix.   6. New onset a-fib with RVR -  Converted to SR ~1200hrs with 3.46sec conversion pause. Frequent PACs.  Metoprolol 50 BID as of yesterday and weaned off cardizem.  Amiodarone also added.  EF is poor. Patient is not a candidate for long-term anticoagulation. CHADSvasc 6.      LOS: 6 days    HAGER, BRYAN PA-C 08/28/2014 8:36 AM As above, patient seen and examined. He denies dyspnea or chest pain. He has converted to sinus rhythm. Would continue amiodarone 400 mg by mouth twice a day for 2 weeks and then decrease to 200 mg daily. Continue aspirin. Not a good Coumadin candidate. Continue metoprolol. Continue beta blocker, hydralazine and nitrates for cardiomyopathy. Not a candidate for further ischemia evaluation. Would not resume diuretics if possible. He does appear to be mildly volume overloaded but severe renal insufficiency. Long-term goals need to be clarified with family. Prognosis poor. Patient should follow-up with Dr. Mayford Knife for cardiac issues. Andrew Dunn

## 2014-08-28 NOTE — Discharge Planning (Signed)
Patient to be discharged to [facility]. Patient's family updated via Char[employer] fundraiser.  Facility: [facility] RN report number: [phone] Transportation: EMS ([address])  Marcelline Deist, [place] - [phone] Clinical Social Work Department Orthopedics [phone]) and Surgical [phone])

## 2014-08-28 NOTE — Progress Notes (Signed)
Inpatient Diabetes Program Recommendations  AACE/ADA: New Consensus Statement on Inpatient Glycemic Control (2013)  Target Ranges:  Prepandial:   less than 140 mg/dL      Peak postprandial:   less than 180 mg/dL (1-2 hours)      Critically ill patients:  140 - 180 mg/dL   Results for CARWYN, LACINA (MRN [id number]) as of 08/28/2014 09:28  Ref. Range 08/27/2014 07:24 08/27/2014 11:08 08/27/2014 16:22 08/27/2014 22:19 08/28/2014 00:12 08/28/2014 05:45 08/28/2014 07:28  Glucose-Capillary Latest Ref Range: 65-99 mg/dL 219 (H) 758 (H) 832 (H) 210 (H) 238 (H) 200 (H) 175 (H)   Reason for Visit: Sepsis/NSTEMI  Diabetes history: DM 2 Outpatient Diabetes medications: Lantus 14 units QHS, Novolog 7 units BID with meals Current orders for Inpatient glycemic control: Lantus 20 units QHS, Novolog 0-20 units Q4hrs.  Inpatient Diabetes Program Recommendations Insulin - Basal: Patient has required 31 units of Novolog over the past 24 hours. Please consider increasing basal insulin to Lantus 25 units QHS.  Thanks,  Christena Deem RN, MSN, [facility] Inpatient Diabetes Coordinator Team Pager [phone]

## 2014-08-28 NOTE — Discharge Summary (Addendum)
Physician Discharge Summary  Andrew Dunn:[id number] DOB: 10-13-1925 DOA: 08/22/2014  PCP: Verneita Griffes, MD  Admit date: 08/22/2014 Discharge date: 08/28/2014  Time spent: 60 minutes  Code Status: DO NOT RESUSCITATE  Recommendations for Outpatient Follow-up:  1. Being discharged to SNF 2. Decrease dose of Amiodarone to 200 mg BID in 2 wks 3. CBC in 3 days to follow WBC counts- monitor for development of fevers 4. Follow daily weights- not on diuretics for heart failure as he has a poor PO intake/significant renal failure and is euvolemic without them 5. Last dose of Keflex tonight at 8 PM  Discharge Condition: stable Diet recommendation: low sodium, heart healthy, diabetic diet  Discharge Diagnoses:  Principal Problem:   Sepsis secondary to UTI Active Problems:   Dementia with behavioral disturbances   Acute respiratory failure with hypoxia   Acute systolic CHF (congestive heart failure)   NSTEMI (non-ST elevated myocardial infarction)   Parkinson's disease   Pernicious anemia   Osteopenia   UTI (lower urinary tract infection)   Acute renal failure superimposed on stage 3 chronic kidney disease   Leukocytosis   Urinary retention   Atrial fibrillation with RVR   Diabetes type 2, uncontrolled   History of present illness:  Andrew Dunn is a 81 y.o. male who resides in a nursing home with a past medical history of Parkinson's disease, dementia, hypertension, diabetes mellitus type 2, chronic kidney disease stage III, congestive heart failure, urinary retention who presented to the hospital with shortness of breath coughing and was found to be hypoxic and wheezing. There is a suspicion that he may have aspirated while he was eating as he suddenly developed coughing and wheezing and congestion after lunch. He had a temperature of 100.2 on the night prior to admission. Initial chest x-ray did not reveal any evidence of pneumonia. He was also noted to have a UTI. He had a  Foley placed over the weekend prior to admission for urinary retention. He pulled the Foley out on the day that he was admitted. He was also noted to have acute renal failure and an elevated troponin of 1.11. Cardiology has been following and assisting in management for acute MI and paroxysmal atrial fibrillation. Respiratory status has improved with diuresis. He has been significantly confused and intermittently agitated throughout the hospital stay.  Hospital Course:  Principal Problem:  Sepsis - Secondary to urinary tract infection  -He was started on vancomycin and cefepime on 5/18 for a possibility of aspiration pneumonia - it was later proven that he did not have a pneumonia and antibiotics were downgraded (see below)  Active Problems: UTI possibly in relation to Foley catheter placed prior to admission -Urine culture growing Proteus which is sensitive to cephalosporins- transitioned to Keflex on 5/21-will complete a seven-day course - last dose would be today  Leukocytosis - WBC count noted to be steadily rising-no signs of infection- UTI currently being adequatedly treated- repeat CXR on 5/23 continues to be negative for pneumonia. No fevers, abdomina pain, diarrhea, rash- no for the leukocytosis determined - follow WBC counts, follow for fevers as nursing facility  Possible aspiration pneumonia -He's had multiple chest x-rays since admission all of which have been free of infiltrate -Oxygen saturation 100% on 2L -vancomycin and cefepime discontinued after last dose on 5/21  Acute respiratory failure with hypoxia -Suspected to be either due to aspiration as mentioned above versus acute CHF exacerbation on admission - most likely due to heart failure as not signs  of aspiration pneumonia  -Has been diuresed appropriately and also was given vancomycin and cefepime from 5/18- 5/21 - SLP eval performed on 5/20 reveals mild pharyngeal phase dysphagia but no overt aspiration   Urinary  retention -Continue Foley catheter and doxazosin  New onset atrial fibrillation with RVR - CHA2DS2-VASc Score- 5 (age, HTN, CHF, DM) - converted to sinus rhythm and therefore Cardizem infusion discontinued -Unfortunately went back into A. fib with RVR on 5/22-Cardizem infusion had to be resumed-cardiology subsequently started oral Amiodarone loaded- he converted back to NSR- cont Amiodarone and metoprolol at 25 mg twice a day - On aspirin 81 mg for long-term anticoagulation-per cardiology, not a candidate for stronger anticoagulants  Acute systolic congestive heart failure  -2-D echo performed on 5/19 reveals an EF of 35% multiple areas of hypokinesis and akinesis-report mentioned below - IV diuretics given but currently on hold due to worsening renal function- Cardiology does not recommend resuming diuretics at this point -Respiratory status stable at this time and not signs of pulmonary edema although mild ankle edema is persent - added Imdur and Hydralazine - unable to tolerate ACE I due to renal failure  NSTEMI -Cardiology has been following- patient has multiple co-morbidities including stage 4 CKD and at this time only medical management is appropriate for him -He was treated medically with heparin infusion, metoprolol, atorvastatin and aspirin- cont medical management   Acute on chronic renal failure- chronic kidney disease stage 4 - Follow creatinine- diuretics currently on hold - BUN/ Cr ratio quite elevated - no need to resume diuretics at this point -renal ultrasound reveals echogenic renal parenchyma bilaterally without hydronephrosis  2 diabetes mellitus uncontrolled -A1c 8.3 -Continue Lantus and sliding scale insulin  Parkinson's disease -Continue Sinemet   Dementia - with behavioral disturbances- has had increased agitation at night- started low dose Seroquel at bedtime which appears to be helping with sleeping - although out med rec states he was taking Aricept as  outpt, he WAS NOT taking it - it is an expired medication and therefore has not been resumed   Procedures: 2-D echo Left ventricle: Poor acoustic windows even with Definity use. Ovreall LVEF is approximately 35% with hypokinesis of the prox/mid inferior wall; akiensis of the distal inferior, distal anterior, distal septal and apex. The cavity size was mildly dilated. Wall thickness was increased in a pattern of mild LVH. - Aortic valve: AV is thickened, calcified with minimally restricted motion. - Mitral valve: There was mild regurgitation. - Left atrium: The atrium was mildly dilated. - Pulmonary arteries: PA peak pressure: 43 mm Hg (S).  Consultations:  Cardiology   Discharge Exam: Filed Weights   08/26/14 0535 08/27/14 0450 08/28/14 0411  Weight: 90.719 kg (200 lb) 91.853 kg (202 lb 8 oz) 92.67 kg (204 lb 4.8 oz)   Filed Vitals:   08/28/14 0815  BP: 140/83  Pulse: 77  Temp: 97.9 F (36.6 C)  Resp: 18    General: Alert, confused to place, time, situation, no agitation at this time- poorly cooperative with exam- no distress Cardiovascular: RRR, no murmurs  Respiratory: clear to auscultation bilaterally- RR in low to mid 20s (this appears to be normal for him) GI: soft, non-tender, non-distended, bowel sound positive MSK: mild pitting edema of legs, no cyanosis or clubbing  Discharge Instructions You were cared for by a hospitalist during your hospital stay. If you have any questions about your discharge medications or the care you received while you were in the hospital after you are  discharged, you can call the unit and asked to speak with the hospitalist on call if the hospitalist that took care of you is not available. Once you are discharged, your primary care physician will handle any further medical issues. Please note that NO REFILLS for any discharge medications will be authorized once you are discharged, as it is imperative that you return to your primary  care physician (or establish a relationship with a primary care physician if you do not have one) for your aftercare needs so that they can reassess your need for medications and monitor your lab values.      Discharge Instructions    Discharge instructions    Complete by:  As directed   Diabetic, low sodium heart healthy diet     Increase activity slowly    Complete by:  As directed             Medication List    STOP taking these medications        acetaminophen 500 MG tablet  Commonly known as:  TYLENOL     donepezil 5 MG tablet  Commonly known as:  ARICEPT     furosemide 40 MG tablet  Commonly known as:  LASIX     potassium chloride SA 20 MEQ tablet  Commonly known as:  K-DUR,KLOR-CON      TAKE these medications        amiodarone 400 MG tablet  Commonly known as:  PACERONE  Take 1 tablet (400 mg total) by mouth 2 (two) times daily.     aspirin EC 81 MG tablet  Take 81 mg by mouth daily.     atorvastatin 80 MG tablet  Commonly known as:  LIPITOR  Take 1 tablet (80 mg total) by mouth daily at 6 PM.     carbidopa-levodopa 25-250 MG per tablet  Commonly known as:  SINEMET IR  Take 1 tablet by mouth 3 (three) times daily.     cephALEXin 250 MG/5ML suspension  Commonly known as:  KEFLEX  Take 10 mLs (500 mg total) by mouth once.     doxazosin 8 MG tablet  Commonly known as:  CARDURA  Take 8 mg by mouth at bedtime.     hydrALAZINE 25 MG tablet  Commonly known as:  APRESOLINE  Take 1 tablet (25 mg total) by mouth every 8 (eight) hours.     insulin aspart 100 UNIT/ML injection  Commonly known as:  novoLOG  Inject 0-20 Units into the skin 3 (three) times daily with meals.     isosorbide mononitrate 30 MG 24 hr tablet  Commonly known as:  IMDUR  Take 1 tablet (30 mg total) by mouth daily.     LANTUS 100 UNIT/ML injection  Generic drug:  insulin glargine  Inject 0.2 mLs (20 Units total) into the skin at bedtime.     metoprolol 50 MG tablet  Commonly  known as:  LOPRESSOR  Take 1 tablet (50 mg total) by mouth 2 (two) times daily.     mirtazapine 15 MG tablet  Commonly known as:  REMERON  Take 15 mg by mouth at bedtime.     multivitamin with minerals Tabs tablet  Take 1 tablet by mouth daily.     nitroGLYCERIN 0.4 MG SL tablet  Commonly known as:  NITROSTAT  Place 0.4 mg under the tongue every 5 (five) minutes as needed for chest pain.     QUEtiapine 25 MG tablet  Commonly known as:  SEROQUEL  Take 1 tablet (25 mg total) by mouth at bedtime.     sertraline 50 MG tablet  Commonly known as:  ZOLOFT  Take 50 mg by mouth daily.     sodium polystyrene 15 GM/60ML suspension  Commonly known as:  KAYEXALATE  Take 15 g by mouth daily.     Vitamin D (Ergocalciferol) 50000 UNITS Caps capsule  Commonly known as:  DRISDOL  Take 50,000 Units by mouth every 30 (thirty) days.       No Known Allergies    The results of significant diagnostics from this hospitalization (including imaging, microbiology, ancillary and laboratory) are listed below for reference.    Significant Diagnostic Studies: US Renal  08/23/2014   CLINICAL DATA:  Elevated BUN and creatinine. Hypertension, diabetes, renal disorder.  EXAM: RENAL / URINARY TRACT ULTRASOUND COMPLETE  COMPARISON:  MRI 07/26/2008  FINDINGS: Right Kidney:  Length: 11.5 cm. Echogenic renal parenchyma. No hydronephrosis or mass.  Left Kidney:  Length: 11.8 cm. Echogenic renal parenchyma. Mid pole cyst is 4.8 x 3.6 x 4.3 cm. No hydronephrosis or solid mass.  Bladder:  A Foley catheter decompresses the bladder.  IMPRESSION: 1. No hydronephrosis.  Echogenic renal parenchyma bilaterally. 2. Right renal cyst.   Electronically Signed   By: Norva Pavlov M.D.   On: 08/23/2014 14:20   Dg Chest Port 1 View  08/27/2014   CLINICAL DATA:  Respiratory distress  EXAM: PORTABLE CHEST - 1 VIEW  COMPARISON:  08/26/2014  FINDINGS: Persistent low lung volumes with central vascular congestion and scattered  bibasilar atelectasis. No significant change in aeration. No enlarging effusion or pneumothorax. Trachea midline. Atherosclerosis of the aorta.  IMPRESSION: Stable low lung volumes with vascular congestion and scattered atelectasis.   Electronically Signed   By: Judie Petit.  Shick M.D.   On: 08/27/2014 09:25   Dg Chest Port 1 View  08/26/2014   CLINICAL DATA:  Acute MI. Acute respiratory failure. Sepsis. Acute systolic CHF. Acute superimposed upon chronic kidney disease. Followup interstitial edema.  EXAM: PORTABLE CHEST - 1 VIEW  COMPARISON:  Portable chest x-rays 08/23/2014, 08/22/2014. Two-view chest x-ray 05/17/2011 and earlier.  FINDINGS: Cardiac silhouette moderately enlarged for AP portable technique, unchanged. Interval improvement in interstitial pulmonary edema since examination 3 days ago, with only minimal interstitial edema persisting. No new pulmonary parenchymal abnormalities. Chronic elevation the right hemidiaphragm with chronic scar/atelectasis at the right lung base.  IMPRESSION: 1. Interval improvement in interstitial edema since the examination 3 days ago, with only minimal interstitial edema persisting. 2. No new abnormalities.   Electronically Signed   By: Hulan Saas M.D.   On: 08/26/2014 09:10   Dg Chest Port 1 View  08/23/2014   CLINICAL DATA:  Shortness of breath, elevated right hemidiaphragm.  EXAM: PORTABLE CHEST - 1 VIEW  COMPARISON:  Portable chest x-ray of Aug 22, 2014  FINDINGS: The left lung is adequately inflated. There is no focal infiltrate. On the right persistent elevation of the hemidiaphragm. There is right basilar atelectasis more conspicuous today. The cardiac silhouette remains enlarged. The pulmonary vascularity is mildly prominent centrally in the pulmonary interstitium is more conspicuous today. No significant pleural effusion is demonstrated. The bony thorax is unremarkable.  IMPRESSION: Slight interval worsening in the appearance of the pulmonary interstitium  suggesting low-grade edema secondary to CHF. There is right basilar atelectasis.   Electronically Signed   By: David  [place] M.D.   On: 08/23/2014 07:29   Dg Chest Port 1 View  08/22/2014   CLINICAL  DATA:  Shortness of breath.  Diabetes.  EXAM: PORTABLE CHEST - 1 VIEW  COMPARISON:  05/17/2011  FINDINGS: Elevated right hemidiaphragm. Mild enlargement of the cardiopericardial silhouette atherosclerotic aortic arch.  Mild atelectasis noted along the right hemidiaphragm. No overt edema identified.  IMPRESSION: 1. Mild enlargement of the cardiopericardial silhouette, without edema. 2. Stable elevation of the right hemidiaphragm, with mild atelectasis at the right lung base. 3. Atherosclerotic aortic arch.   Electronically Signed   By: Gaylyn Rong M.D.   On: 08/22/2014 17:28   Dg Swallowing Func-speech Pathology  08/24/2014    Objective Swallowing Evaluation:    Patient Details  Name: Andrew Dunn MRN: [id number] Date of Birth: 1925/12/27  Today's Date: 08/24/2014 Time: SLP Start Time (ACUTE ONLY): 1030-SLP Stop Time (ACUTE ONLY): 1100 SLP Time Calculation (min) (ACUTE ONLY): 30 min  Past Medical History:  Past Medical History  Diagnosis Date  . Hypertension   . Diabetes mellitus   . Anxiety   . Renal disorder   . Parkinson disease   . Lumbar radiculopathy   . Osteopenia   . CHF (congestive heart failure)    Past Surgical History:  Past Surgical History  Procedure Laterality Date  . Rotator cuff repair    . Left toenail removed    . Cholecystectomy     HPI:  Other Pertinent Information: 81 y.o. male, resident of SNF,with history  of Parkinson's dementia, hypertension, type 2 diabetes mellitus, chronic  kidney disease stage III, history of CHF presented to the ED with sudden  onset hypoxia/ARF, sepsis secondary to UTI/pna.  Some coughing noted with  meals, hence swallow eval ordered.    No Data Recorded  Assessment / Plan / Recommendation CHL IP CLINICAL IMPRESSIONS 08/24/2014  Therapy Diagnosis Mild  pharyngeal phase dysphagia  Clinical Impression Pt presents with a mlld oropharyngeal dysphagia marked  primarily by prolonged oral preparation of mechanical consistencies, mild  swallow delay, mod pharyngeal residue.  When consuming thin liquids in  isolation, pt demonstrates adequate airway protection with intermittent  high penetration underlying the epiglottis.  When drinking thins in  conjunction with eating solids, the solids collect in the valleculae, and  the thins are more likely to penetrate the larynx to the vocal cords.   Penetration elicits a protective cough, which ejects material from larynx.   No aspiration was observed.  Recommend resuming a regular diet with thin  liquids; meds should be crushed and given in puree given risk of lodging  in valleculae.  Pt should alternate solids/liquids to maximize safety.   SLP will f/u for education and diet toleration.       CHL IP TREATMENT RECOMMENDATION 08/24/2014  Treatment Recommendations Therapy as outlined in treatment plan below     CHL IP DIET RECOMMENDATION 08/24/2014  SLP Diet Recommendations Age appropriate regular solids;Thin  Liquid Administration via (None)  Medication Administration Crushed with puree  Compensations Small sips/bites;Slow rate;Follow solids with liquid  Postural Changes and/or Swallow Maneuvers (None)     CHL IP OTHER RECOMMENDATIONS 08/24/2014  Recommended Consults (None)  Oral Care Recommendations Oral care BID  Other Recommendations (None)     No flowsheet data found.   CHL IP FREQUENCY AND DURATION 08/24/2014  Speech Therapy Frequency (ACUTE ONLY) min 3x week  Treatment Duration 1 week         SLP Swallow Goals No flowsheet data found.  No flowsheet data found.    CHL IP REASON FOR REFERRAL 08/24/2014  Reason for Referral Objectively evaluate  swallowing function     CHL IP ORAL PHASE 08/24/2014  Lips (None)  Tongue (None)  Mucous membranes (None)  Nutritional status (None)  Other (None)  Oxygen therapy (None)  Oral Phase Impaired   Oral - Pudding Teaspoon (None)  Oral - Pudding Cup (None)  Oral - Honey Teaspoon (None)  Oral - Honey Cup (None)  Oral - Honey Syringe (None)  Oral - Nectar Teaspoon (None)  Oral - Nectar Cup (None)  Oral - Nectar Straw (None)  Oral - Nectar Syringe (None)  Oral - Ice Chips (None)  Oral - Thin Teaspoon (None)  Oral - Thin Cup (None)  Oral - Thin Straw (None)  Oral - Thin Syringe (None)  Oral - Puree (None)  Oral - Mechanical Soft (None)  Oral - Regular (None)  Oral - Multi-consistency (None)  Oral - Pill (None)  Oral Phase - Comment (None)      CHL IP PHARYNGEAL PHASE 08/24/2014  Pharyngeal Phase Impaired  Pharyngeal - Pudding Teaspoon (None)  Penetration/Aspiration details (pudding teaspoon) (None)  Pharyngeal - Pudding Cup (None)  Penetration/Aspiration details (pudding cup) (None)  Pharyngeal - Honey Teaspoon (None)  Penetration/Aspiration details (honey teaspoon) (None)  Pharyngeal - Honey Cup (None)  Penetration/Aspiration details (honey cup) (None)  Pharyngeal - Honey Syringe (None)  Penetration/Aspiration details (honey syringe) (None)  Pharyngeal - Nectar Teaspoon (None)  Penetration/Aspiration details (nectar teaspoon) (None)  Pharyngeal - Nectar Cup (None)  Penetration/Aspiration details (nectar cup) (None)  Pharyngeal - Nectar Straw (None)  Penetration/Aspiration details (nectar straw) (None)  Pharyngeal - Nectar Syringe (None)  Penetration/Aspiration details (nectar syringe) (None)  Pharyngeal - Ice Chips (None)  Penetration/Aspiration details (ice chips) (None)  Pharyngeal - Thin Teaspoon (None)  Penetration/Aspiration details (thin teaspoon) (None)  Pharyngeal - Thin Cup (None)  Penetration/Aspiration details (thin cup) (None)  Pharyngeal - Thin Straw (None)  Penetration/Aspiration details (thin straw) (None)  Pharyngeal - Thin Syringe (None)  Penetration/Aspiration details (thin syringe') (None)  Pharyngeal - Puree (None)  Penetration/Aspiration details (puree) (None)  Pharyngeal - Mechanical Soft  (None)  Penetration/Aspiration details (mechanical soft) (None)  Pharyngeal - Regular (None)  Penetration/Aspiration details (regular) (None)  Pharyngeal - Multi-consistency (None)  Penetration/Aspiration details (multi-consistency) (None)  Pharyngeal - Pill (None)  Penetration/Aspiration details (pill) (None)  Pharyngeal Comment high penetration thin liquids - elicits protective  cough.  No aspiration noted.      No flowsheet data found.  No flowsheet data found.        Amanda L. Samson Frederic, [place] CCC/SLP Pager [phone]  Blenda Mounts Laurice 08/24/2014, 11:55 AM     Microbiology: Recent Results (from the past 240 hour(s))  Urine culture     Status: None   Collection Time: 08/22/14  3:28 PM  Result Value Ref Range Status   Specimen Description URINE, CATHETERIZED  Final   Special Requests NONE  Final   Colony Count   Final    >=100,000 COLONIES/ML Performed at [facility]    Culture   Final    PROTEUS MIRABILIS Performed at [facility]    Report Status 08/24/2014 FINAL  Final   Organism ID, Bacteria PROTEUS MIRABILIS  Final      Susceptibility   Proteus mirabilis - MIC*    AMPICILLIN >=32 RESISTANT Resistant     CEFAZOLIN <=1 SENSITIVE Sensitive     CEFTRIAXONE <=1 SENSITIVE Sensitive     CIPROFLOXACIN >=4 RESISTANT Resistant     GENTAMICIN <=1 SENSITIVE Sensitive     LEVOFLOXACIN >=8 RESISTANT Resistant  NITROFURANTOIN 128 RESISTANT Resistant     TOBRAMYCIN <=1 SENSITIVE Sensitive     TRIMETH/SULFA >=320 RESISTANT Resistant     PIP/TAZO RESISTANT      * PROTEUS MIRABILIS  Blood Culture (routine x 2)     Status: None   Collection Time: 08/22/14  3:43 PM  Result Value Ref Range Status   Specimen Description BLOOD RIGHT FOREARM  Final   Special Requests BOTTLES DRAWN AEROBIC AND ANAEROBIC 5CC  Final   Culture   Final    NO GROWTH 5 DAYS Performed at [facility]    Report Status 08/28/2014 FINAL  Final  Blood Culture (routine x 2)     Status: None  (Preliminary result)   Collection Time: 08/22/14  4:22 PM  Result Value Ref Range Status   Specimen Description BLOOD RIGHT HAND  Final   Special Requests BOTTLES DRAWN AEROBIC ONLY 3CC  Final   Culture   Final           BLOOD CULTURE RECEIVED NO GROWTH TO DATE CULTURE WILL BE HELD FOR 5 DAYS BEFORE ISSUING A FINAL NEGATIVE REPORT Performed at [facility]    Report Status PENDING  Incomplete  MRSA PCR Screening     Status: None   Collection Time: 08/22/14  6:56 PM  Result Value Ref Range Status   MRSA by PCR NEGATIVE NEGATIVE Final    Comment:        The GeneXpert MRSA Assay (FDA approved for NASAL specimens only), is one component of a comprehensive MRSA colonization surveillance program. It is not intended to diagnose MRSA infection nor to guide or monitor treatment for MRSA infections.      Labs: Basic Metabolic Panel:  Recent Labs Lab 08/24/14 0112 08/25/14 0538 08/26/14 0459 08/26/14 1858 08/27/14 0305 08/27/14 1428 08/28/14 0404  NA 138 135 137 138 140 141 142  K 3.9 4.8 5.3* 5.6* 5.6* 5.1 5.1  CL 104 101 103 107 107 108 109  CO2 26 25 25 24 24 25 25   GLUCOSE 152* 296* 149* 145* 144* 192* 233*  BUN 50* 66* 94* 94* 89* 91* 97*  CREATININE 2.73* 2.63* 2.56* 2.35* 2.36* 2.38* 2.43*  CALCIUM 8.3* 8.1* 8.1* 8.0* 8.1* 8.0* 8.4*  MG 2.5* 2.7* 2.6*  --  2.5*  --  2.7*   Liver Function Tests:  Recent Labs Lab 08/23/14 1207 08/24/14 0112 08/25/14 0538 08/26/14 0459 08/27/14 0305  AST 27 30 18 23  46*  ALT 5* 6* 8* 8* 6*  ALKPHOS 59 66 73 63 57  BILITOT 0.6 0.4 0.3 0.4 0.6  PROT 5.3* 6.1* 5.9* 5.4* 5.3*  ALBUMIN 2.1* 2.3* 2.2* 2.4* 2.3*   No results for input(s): LIPASE, AMYLASE in the last 168 hours. No results for input(s): AMMONIA in the last 168 hours. CBC:  Recent Labs Lab 08/24/14 0112 08/25/14 0538 08/26/14 0459 08/27/14 0305 08/28/14 0404  WBC 12.5* 13.2* 15.8* 17.2* 17.0*  NEUTROABS 11.2* 11.8* 12.7* 13.1*  --   HGB 10.4* 10.8*  9.8* 9.6* 8.4*  HCT 32.6* 33.5* 30.5* 29.7* 27.0*  MCV 85.3 85.0 84.7 85.3 87.7  PLT 208 246 292 285 272   Cardiac Enzymes:  Recent Labs Lab 08/22/14 1924 08/23/14 0909 08/23/14 1207 08/23/14 1929 08/24/14 0117  TROPONINI 2.74* 4.26* 4.44* 3.66* 3.27*   BNP: BNP (last 3 results)  Recent Labs  08/22/14 1546  BNP 772.4*    ProBNP (last 3 results) No results for input(s): PROBNP in the last 8760 hours.  CBG:  Recent Labs Lab 08/27/14 1622 08/27/14 2219 08/28/14 0012 08/28/14 0545 08/28/14 0728  GLUCAP 200* 210* 238* 200* 175*       SignedCalvert Cantor, MD [facility] 08/28/2014, 10:13 AM

## 2014-08-29 LAB — CULTURE, BLOOD (ROUTINE X 2): Culture: NO GROWTH

## 2014-12-15 ENCOUNTER — Encounter (HOSPITAL_COMMUNITY): Payer: Self-pay

## 2014-12-15 ENCOUNTER — Emergency Department (HOSPITAL_COMMUNITY)
Admission: EM | Admit: 2014-12-15 | Discharge: 2014-12-15 | Disposition: A | Discharge status: Home / Self Care | Payer: Medicare Other | Attending: Emergency Medicine | Admitting: Emergency Medicine

## 2014-12-15 ENCOUNTER — Emergency Department (HOSPITAL_COMMUNITY): Payer: Medicare Other

## 2014-12-15 DIAGNOSIS — F419 Anxiety disorder, unspecified: Secondary | ICD-10-CM | POA: Diagnosis not present

## 2014-12-15 DIAGNOSIS — Z79899 Other long term (current) drug therapy: Secondary | ICD-10-CM | POA: Insufficient documentation

## 2014-12-15 DIAGNOSIS — Z8739 Personal history of other diseases of the musculoskeletal system and connective tissue: Secondary | ICD-10-CM | POA: Diagnosis not present

## 2014-12-15 DIAGNOSIS — I10 Essential (primary) hypertension: Secondary | ICD-10-CM | POA: Diagnosis not present

## 2014-12-15 DIAGNOSIS — Z87891 Personal history of nicotine dependence: Secondary | ICD-10-CM | POA: Insufficient documentation

## 2014-12-15 DIAGNOSIS — R339 Retention of urine, unspecified: Secondary | ICD-10-CM

## 2014-12-15 DIAGNOSIS — E119 Type 2 diabetes mellitus without complications: Secondary | ICD-10-CM | POA: Insufficient documentation

## 2014-12-15 DIAGNOSIS — I509 Heart failure, unspecified: Secondary | ICD-10-CM | POA: Insufficient documentation

## 2014-12-15 DIAGNOSIS — N400 Enlarged prostate without lower urinary tract symptoms: Secondary | ICD-10-CM

## 2014-12-15 DIAGNOSIS — N401 Enlarged prostate with lower urinary tract symptoms: Secondary | ICD-10-CM | POA: Insufficient documentation

## 2014-12-15 DIAGNOSIS — N390 Urinary tract infection, site not specified: Secondary | ICD-10-CM

## 2014-12-15 DIAGNOSIS — G20 Parkinson's disease: Secondary | ICD-10-CM | POA: Diagnosis not present

## 2014-12-15 DIAGNOSIS — Z794 Long term (current) use of insulin: Secondary | ICD-10-CM | POA: Diagnosis not present

## 2014-12-15 LAB — BASIC METABOLIC PANEL
ANION GAP: 7 (ref 5–15)
BUN: 45 mg/dL — ABNORMAL HIGH (ref 6–20)
CO2: 25 mmol/L (ref 22–32)
Calcium: 8.4 mg/dL — ABNORMAL LOW (ref 8.9–10.3)
Chloride: 105 mmol/L (ref 101–111)
Creatinine, Ser: 1.56 mg/dL — ABNORMAL HIGH (ref 0.61–1.24)
GFR calc Af Amer: 45 mL/min — ABNORMAL LOW (ref >60)
GFR, EST NON AFRICAN AMERICAN: 39 mL/min — AB (ref >60)
Glucose, Bld: 139 mg/dL — ABNORMAL HIGH (ref 65–99)
POTASSIUM: 4.4 mmol/L (ref 3.5–5.1)
SODIUM: 137 mmol/L (ref 135–145)

## 2014-12-15 LAB — URINE MICROSCOPIC-ADD ON

## 2014-12-15 LAB — URINALYSIS, ROUTINE W REFLEX MICROSCOPIC
Bilirubin Urine: NEGATIVE
Glucose, UA: NEGATIVE mg/dL
Hgb urine dipstick: NEGATIVE
Ketones, ur: NEGATIVE mg/dL
Nitrite: NEGATIVE
Protein, ur: 30 mg/dL — AB
Specific Gravity, Urine: 1.012 (ref 1.005–1.030)
UROBILINOGEN UA: 0.2 mg/dL (ref 0.0–1.0)
pH: 5 (ref 5.0–8.0)

## 2014-12-15 LAB — CBC
HEMATOCRIT: 26.1 % — AB (ref 39.0–52.0)
HEMOGLOBIN: 7.8 g/dL — AB (ref 13.0–17.0)
MCH: 21.4 pg — ABNORMAL LOW (ref 26.0–34.0)
MCHC: 29.9 g/dL — ABNORMAL LOW (ref 30.0–36.0)
MCV: 71.7 fL — ABNORMAL LOW (ref 78.0–100.0)
Platelets: 243 10*3/uL (ref 150–400)
RBC: 3.64 MIL/uL — ABNORMAL LOW (ref 4.22–5.81)
RDW: 17.9 % — AB (ref 11.5–15.5)
WBC: 12.2 10*3/uL — ABNORMAL HIGH (ref 4.0–10.5)

## 2014-12-15 MED ORDER — DEXTROSE 5 % IV SOLN
1.0000 g | Freq: Once | INTRAVENOUS | Status: AC
  Administered 2014-12-15: 1 g via INTRAVENOUS
  Filled 2014-12-15: qty 10

## 2014-12-15 MED ORDER — CEPHALEXIN 500 MG PO CAPS: 500.0000 mg | ORAL_CAPSULE | Freq: Two times a day (BID) | ORAL | Status: AC

## 2014-12-15 NOTE — ED Notes (Signed)
Pt verbalized understanding of d/c instructions and report called to [facility] therapist. Pt stablen and states "I feel much better"

## 2014-12-15 NOTE — Discharge Instructions (Signed)
Andrew Dunn came with urinary retention. He had > 400 ml of urine. We placed a new 22 French foley catheter, and we irrigated his bladder.  There is signs of UTI on the urine here. He had abdominal pain - CT scan ordered, and the lower abdomen has some inflammatory findings in the FAT. The bowel looks goot, no diverticulitis. The prostate is really large. PLEASE ENSURE HE IS SEEN BY UROLOGIST. Please have the primary provider at the nursing home keep an eye on the abdominal pain.  Please return to the ER if your symptoms worsen; you have increased pain, fevers, chills, inability to keep any medications down, confusion. Otherwise see the outpatient doctor as requested.   Urinary Tract Infection Urinary tract infections (UTIs) can develop anywhere along your urinary tract. Your urinary tract is your body's drainage system for removing wastes and extra water. Your urinary tract includes two kidneys, two ureters, a bladder, and a urethra. Your kidneys are a pair of bean-shaped organs. Each kidney is about the size of your fist. They are located below your ribs, one on each side of your spine. CAUSES Infections are caused by microbes, which are microscopic organisms, including fungi, viruses, and bacteria. These organisms are so small that they can only be seen through a microscope. Bacteria are the microbes that most commonly cause UTIs. SYMPTOMS  Symptoms of UTIs may vary by age and gender of the patient and by the location of the infection. Symptoms in young women typically include a frequent and intense urge to urinate and a painful, burning feeling in the bladder or urethra during urination. Older women and men are more likely to be tired, shaky, and weak and have muscle aches and abdominal pain. A fever may mean the infection is in your kidneys. Other symptoms of a kidney infection include pain in your back or sides below the ribs, nausea, and vomiting. DIAGNOSIS To diagnose a UTI, your caregiver  will ask you about your symptoms. Your caregiver also will ask to provide a urine sample. The urine sample will be tested for bacteria and white blood cells. White blood cells are made by your body to help fight infection. TREATMENT  Typically, UTIs can be treated with medication. Because most UTIs are caused by a bacterial infection, they usually can be treated with the use of antibiotics. The choice of antibiotic and length of treatment depend on your symptoms and the type of bacteria causing your infection. HOME CARE INSTRUCTIONS  If you were prescribed antibiotics, take them exactly as your caregiver instructs you. Finish the medication even if you feel better after you have only taken some of the medication.  Drink enough water and fluids to keep your urine clear or pale yellow.  Avoid caffeine, tea, and carbonated beverages. They tend to irritate your bladder.  Empty your bladder often. Avoid holding urine for long periods of time.  Empty your bladder before and after sexual intercourse.  After a bowel movement, women should cleanse from front to back. Use each tissue only once. SEEK MEDICAL CARE IF:   You have back pain.  You develop a fever.  Your symptoms do not begin to resolve within 3 days. SEEK IMMEDIATE MEDICAL CARE IF:   You have severe back pain or lower abdominal pain.  You develop chills.  You have nausea or vomiting.  You have continued burning or discomfort with urination. MAKE SURE YOU:   Understand these instructions.  Will watch your condition.  Will get help right  away if you are not doing well or get worse. Document Released: 12/31/2004 Document Revised: 09/22/2011 Document Reviewed: 05/01/2011 [facility] Patient Information 2015 [place], [place]. This information is not intended to replace advice given to you by your health care provider. Make sure you discuss any questions you have with your health care provider. Abdominal Pain Many things can cause  abdominal pain. Usually, abdominal pain is not caused by a disease and will improve without treatment. It can often be observed and treated at home. Your health care provider will do a physical exam and possibly order blood tests and X-rays to help determine the seriousness of your pain. However, in many cases, more time must pass before a clear cause of the pain can be found. Before that point, your health care provider may not know if you need more testing or further treatment. HOME CARE INSTRUCTIONS  Monitor your abdominal pain for any changes. The following actions may help to alleviate any discomfort you are experiencing:  Only take over-the-counter or prescription medicines as directed by your health care provider.  Do not take laxatives unless directed to do so by your health care provider.  Try a clear liquid diet (broth, tea, or water) as directed by your health care provider. Slowly move to a bland diet as tolerated. SEEK MEDICAL CARE IF:  You have unexplained abdominal pain.  You have abdominal pain associated with nausea or diarrhea.  You have pain when you urinate or have a bowel movement.  You experience abdominal pain that wakes you in the night.  You have abdominal pain that is worsened or improved by eating food.  You have abdominal pain that is worsened with eating fatty foods.  You have a fever. SEEK IMMEDIATE MEDICAL CARE IF:   Your pain does not go away within 2 hours.  You keep throwing up (vomiting).  Your pain is felt only in portions of the abdomen, such as the right side or the left lower portion of the abdomen.  You pass bloody or black tarry stools. MAKE SURE YOU:  Understand these instructions.   Will watch your condition.   Will get help right away if you are not doing well or get worse.  Document Released: 12/31/2004 Document Revised: 03/28/2013 Document Reviewed: 11/30/2012 [facility] Patient Information 2015 [place], [place]. This information  is not intended to replace advice given to you by your health care provider. Make sure you discuss any questions you have with your health care provider.

## 2014-12-15 NOTE — ED Notes (Signed)
Patient transported to CT scan . 

## 2014-12-15 NOTE — ED Provider Notes (Addendum)
CSN: [id number]     Arrival date & time 12/15/14  0023 History  This chart was scribed for Andrew Kaplan, MD by Evon Slack, ED Scribe. This patient was seen in room A02C/A02C and the patient's care was started at 12:31 AM.     Chief Complaint  Patient presents with  . Groin Pain  . Urinary Retention   The history is provided by the patient and the EMS personnel. No language interpreter was used.   Andrew Dunn is a 81 y.o. male brought in by ambulance, who presents to the Emergency Department complaining of groin and suparbupic pain onset several days. Pt presents with associated urinary retention. Pt presents from nursing facility and per ems they state this morning from 7 am to 3 Pm he had normal urine out. Per ems they state from 3 pm until just PTA pt only produced about 25ml of urine. Ems states that the catheter was removed just PTA and they noticed clots of blood when removing the catheter. He states that his last BM was at the beginning of the week. Pt doesn't report any other symptoms.      Past Medical History  Diagnosis Date  . Hypertension   . Diabetes mellitus   . Anxiety   . Renal disorder   . Parkinson disease   . Lumbar radiculopathy   . Osteopenia   . CHF (congestive heart failure)    Past Surgical History  Procedure Laterality Date  . Rotator cuff repair    . Left toenail removed    . Cholecystectomy     Family History  Problem Relation Age of Onset  . Heart attack Father   . Parkinson's disease Mother    Social History  Substance Use Topics  . Smoking status: Former Smoker -- 1.00 packs/day for 35 years    Types: Cigarettes    Quit date: 03/06/2008  . Smokeless tobacco: Never Used  . Alcohol Use: No    Review of Systems A complete 10 system review of systems was obtained and all systems are negative except as noted in the Andrew and PMH.    Allergies  Review of patient's allergies indicates no known allergies.  Home  Medications   Prior to Admission medications   Medication Sig Start Date End Date Taking? Authorizing Provider  atorvastatin (LIPITOR) 80 MG tablet Take 1 tablet (80 mg total) by mouth daily at 6 PM. 08/28/14  Yes Calvert Cantor, MD  carbidopa-levodopa (SINEMET) 25-250 MG per tablet Take 1 tablet by mouth 3 (three) times daily.     Yes Historical Provider, MD  doxazosin (CARDURA) 8 MG tablet Take 8 mg by mouth at bedtime.     Yes Historical Provider, MD  furosemide (LASIX) 20 MG tablet Take 20 mg by mouth daily.   Yes Historical Provider, MD  hydrALAZINE (APRESOLINE) 25 MG tablet Take 1 tablet (25 mg total) by mouth every 8 (eight) hours. 08/28/14  Yes Calvert Cantor, MD  insulin aspart (NOVOLOG) 100 UNIT/ML injection Inject 0-20 Units into the skin 3 (three) times daily with meals. Patient taking differently: Inject 6 Units into the skin 3 (three) times daily with meals.  08/28/14  Yes Calvert Cantor, MD  LANTUS 100 UNIT/ML injection Inject 0.2 mLs (20 Units total) into the skin at bedtime. Patient taking differently: Inject 10 Units into the skin at bedtime.  08/28/14  Yes Calvert Cantor, MD  metoprolol (LOPRESSOR) 50 MG tablet Take 1 tablet (50 mg total) by mouth 2 (  two) times daily. 08/28/14  Yes Calvert Cantor, MD  morphine (ROXANOL) 20 MG/ML concentrated solution Take 5 mg by mouth every 4 (four) hours as needed for severe pain.   Yes Historical Provider, MD  nitroGLYCERIN (NITROSTAT) 0.4 MG SL tablet Place 0.4 mg under the tongue every 5 (five) minutes as needed for chest pain.   Yes Historical Provider, MD  sertraline (ZOLOFT) 50 MG tablet Take 50 mg by mouth daily. 08/06/14  Yes Historical Provider, MD  UNABLE TO FIND Take 120 mLs by mouth 2 (two) times daily. Med Name: Magic Cup   Yes Historical Provider, MD  Vitamin D, Ergocalciferol, (DRISDOL) 50000 UNITS CAPS Take 50,000 Units by mouth every 30 (thirty) days.    Yes Historical Provider, MD  amiodarone (PACERONE) 400 MG tablet Take 1 tablet (400 mg  total) by mouth 2 (two) times daily. Patient not taking: Reported on 12/15/2014 08/28/14   Calvert Cantor, MD  cephALEXin (KEFLEX) 500 MG capsule Take 1 capsule (500 mg total) by mouth 2 (two) times daily. 12/15/14   Andrew Kaplan, MD  isosorbide mononitrate (IMDUR) 30 MG 24 hr tablet Take 1 tablet (30 mg total) by mouth daily. Patient not taking: Reported on 12/15/2014 08/28/14   Calvert Cantor, MD  QUEtiapine (SEROQUEL) 25 MG tablet Take 1 tablet (25 mg total) by mouth at bedtime. Patient not taking: Reported on 12/15/2014 08/28/14   Calvert Cantor, MD   BP 132/50 mmHg  Pulse 64  Temp(Src) 98.8 F (37.1 C) (Oral)  Resp 14  Ht 6' (1.829 m)  Wt 204 lb (92.534 kg)  BMI 27.66 kg/m2  SpO2 93%    Physical Exam  Constitutional: He appears well-developed and well-nourished.  HENT:  Head: Normocephalic and atraumatic.  Eyes: Conjunctivae are normal. Right eye exhibits no discharge. Left eye exhibits no discharge.  Cardiovascular: Normal rate, regular rhythm and normal heart sounds.   Pulmonary/Chest: Effort normal and breath sounds normal. No respiratory distress. He has no wheezes. He has no rales.  Abdominal:  abdomen firm, diffuse tenderness worse in the lower quadrants but also present in periumbilical  region no ecchymosis noted.   Neurological: He is alert. Coordination normal.  Skin: Skin is warm and dry. No rash noted. He is not diaphoretic. No erythema.  Psychiatric: He has a normal mood and affect.  Nursing note and vitals reviewed.   ED Course  Procedures (including critical care time) DIAGNOSTIC STUDIES: Oxygen Saturation is 95% on RA, adequate by my interpretation.    COORDINATION OF CARE: 12:43 AM-Discussed treatment plan with pt at bedside and pt agreed to plan.  2:27 AM- 22 gauge foley catheter placed. Pt is irrigating no gross hematuria noticed. Pt continues to have persistent abdomin la pain in the LLQ. CT of abdomen ordered.    Labs Review Labs Reviewed  URINALYSIS,  ROUTINE W REFLEX MICROSCOPIC (NOT AT [facility]) - Abnormal; Notable for the following:    APPearance CLOUDY (*)    Protein, ur 30 (*)    Leukocytes, UA MODERATE (*)    All other components within normal limits  BASIC METABOLIC PANEL - Abnormal; Notable for the following:    Glucose, Bld 139 (*)    BUN 45 (*)    Creatinine, Ser 1.56 (*)    Calcium 8.4 (*)    GFR calc non Af Amer 39 (*)    GFR calc Af Amer 45 (*)    All other components within normal limits  CBC - Abnormal; Notable for the following:  WBC 12.2 (*)    RBC 3.64 (*)    Hemoglobin 7.8 (*)    HCT 26.1 (*)    MCV 71.7 (*)    MCH 21.4 (*)    MCHC 29.9 (*)    RDW 17.9 (*)    All other components within normal limits  URINE MICROSCOPIC-ADD ON - Abnormal; Notable for the following:    Casts HYALINE CASTS (*)    All other components within normal limits  URINE CULTURE    Imaging Review Ct Abdomen Pelvis Wo Contrast  12/15/2014   CLINICAL DATA:  Left flank pain into the low back. Gross hematuria. Unable to urinate for 1 day. Foley in place.  EXAM: CT ABDOMEN AND PELVIS WITHOUT CONTRAST  TECHNIQUE: Multidetector CT imaging of the abdomen and pelvis was performed following the standard protocol without IV contrast.  COMPARISON:  07/05/2008  FINDINGS: Examination is technically limited due to motion artifact. Small right pleural effusion with basilar atelectasis. Cardiac enlargement. Small pericardial effusion. Small esophageal hiatal hernia.  Gallbladder is surgically absent. No bile duct dilatation. Unenhanced appearance of the liver, spleen, pancreas, adrenal glands, inferior vena cava, is unremarkable. Mild prominence of retroperitoneal lymph nodes, largest measuring about 12 mm diameter. Appearance is similar to prior study, probably representing reactive nodes. 3.8 cm diameter cyst arising from the midpole right kidney. No significant change since previous study. Tiny calcification demonstrated in the midpole left kidney is probably  a vascular calcification. No hydronephrosis or hydroureter. No renal, ureteral, or bladder stones. Bladder is decompressed with a Foley catheter in place. Stomach, small bowel, and colon are not abnormally distended. Contrast material flows through to the colon without evidence of small bowel obstruction. No free air or free fluid in the abdomen. Small umbilical hernia containing fat.  Pelvis: Marked enlargement of the prostate gland at 6.8 x 6.8 cm diameter. Size is increased since the previous study. This may contribute to bladder outlet obstruction. There is infiltration in the pelvic fat bilaterally but most prominent on the left. This is nonspecific and may represent inflammatory process. Scattered diverticula in the sigmoid colon without evidence of diverticulitis. Appendix is normal. No free or loculated pelvic fluid collections. No pelvic mass or lymphadenopathy. Fat in the inguinal canals bilaterally. Degenerative changes in the spine and hips. No destructive bone lesions identified. Compression of the superior endplate of L2. Slight anterior subluxation of L4 on L5. These changes are new since the previous study but age indeterminate.  IMPRESSION: No renal or ureteral stone or obstruction. Bladder is decompressed with a Foley catheter. Prominent enlargement of the prostate gland possibly contributing to bladder outlet obstruction. Nonspecific infiltration in the pelvic fat. No evidence of diverticulitis. Small right pleural effusion with basilar atelectasis. Small pericardial effusion.   Electronically Signed   By: Burman Nieves M.D.   On: 12/15/2014 05:35      EKG Interpretation None      MDM   Final diagnoses:  Enlarged prostate  UTI (lower urinary tract infection)  Urinary retention     I personally performed the services described in this documentation, which was scribed in my presence. The recorded information has been reviewed and is accurate.  Pt came in with urinary  retention. He was in significant distress. He has > 400 cc of Post void residual, and 22 Fr, 3way foley was placed due to reports of blood clot. We didn't have any blood clots here, and the irrigation of bladder was pretty benign.  On repeat exam he  had severe lower quadrant tenderness still. Labs revealed slightly low Hb and slightly elevated WC - both non specific, and patient is not a good historian (reports pain is not new), CT ordered and he has some inflammatory changes for the lower quadrant fat. Repeat exam at 6:30 unchanged. No perforation, no diverticulitis, no AAA.   There is no DKA. The UA is + for Leuk and 7-10 WBC on in and out cath. UTI possible. CT is neg, so dont think any other acute abd process is the cause. Will d/c.   Andrew Kaplan, MD 12/15/14 [id number]  Andrew Kaplan, MD 12/15/14 [id number]

## 2014-12-15 NOTE — ED Notes (Signed)
Per EMS, pt from [facility] in [place]. Pt complains of groin pain. Pt had catheter in and urine output was normal for morning shift from 0700-1500, and from 1500-2300 only 55ml total. Catheter was taken out 1 hour prior to arrival. It was reported that when the catheter was pulled there was clots. EMS VS BP 129/80, HR 72, SPO2 97%, Cbg 157

## 2014-12-16 LAB — URINE CULTURE

## 2014-12-20 ENCOUNTER — Encounter (HOSPITAL_COMMUNITY): Payer: Self-pay

## 2014-12-20 ENCOUNTER — Emergency Department (HOSPITAL_COMMUNITY)
Admission: EM | Admit: 2014-12-20 | Discharge: 2014-12-20 | Disposition: A | Discharge status: Skilled Nursing Facility | Payer: Medicare Other | Attending: Emergency Medicine | Admitting: Emergency Medicine

## 2014-12-20 DIAGNOSIS — Y846 Urinary catheterization as the cause of abnormal reaction of the patient, or of later complication, without mention of misadventure at the time of the procedure: Secondary | ICD-10-CM | POA: Diagnosis not present

## 2014-12-20 DIAGNOSIS — Z87891 Personal history of nicotine dependence: Secondary | ICD-10-CM | POA: Diagnosis not present

## 2014-12-20 DIAGNOSIS — F0390 Unspecified dementia without behavioral disturbance: Secondary | ICD-10-CM | POA: Diagnosis not present

## 2014-12-20 DIAGNOSIS — R339 Retention of urine, unspecified: Secondary | ICD-10-CM | POA: Diagnosis present

## 2014-12-20 DIAGNOSIS — Z794 Long term (current) use of insulin: Secondary | ICD-10-CM | POA: Insufficient documentation

## 2014-12-20 DIAGNOSIS — E119 Type 2 diabetes mellitus without complications: Secondary | ICD-10-CM | POA: Insufficient documentation

## 2014-12-20 DIAGNOSIS — I10 Essential (primary) hypertension: Secondary | ICD-10-CM | POA: Diagnosis not present

## 2014-12-20 DIAGNOSIS — Z79899 Other long term (current) drug therapy: Secondary | ICD-10-CM | POA: Diagnosis not present

## 2014-12-20 DIAGNOSIS — T83098A Other mechanical complication of other indwelling urethral catheter, initial encounter: Secondary | ICD-10-CM | POA: Insufficient documentation

## 2014-12-20 DIAGNOSIS — Z87448 Personal history of other diseases of urinary system: Secondary | ICD-10-CM | POA: Diagnosis not present

## 2014-12-20 DIAGNOSIS — G20 Parkinson's disease: Secondary | ICD-10-CM | POA: Insufficient documentation

## 2014-12-20 DIAGNOSIS — I509 Heart failure, unspecified: Secondary | ICD-10-CM | POA: Insufficient documentation

## 2014-12-20 DIAGNOSIS — F419 Anxiety disorder, unspecified: Secondary | ICD-10-CM | POA: Insufficient documentation

## 2014-12-20 DIAGNOSIS — Z792 Long term (current) use of antibiotics: Secondary | ICD-10-CM | POA: Insufficient documentation

## 2014-12-20 DIAGNOSIS — T839XXA Unspecified complication of genitourinary prosthetic device, implant and graft, initial encounter: Secondary | ICD-10-CM

## 2014-12-20 LAB — URINALYSIS, ROUTINE W REFLEX MICROSCOPIC
Bilirubin Urine: NEGATIVE
GLUCOSE, UA: NEGATIVE mg/dL
KETONES UR: NEGATIVE mg/dL
LEUKOCYTES UA: NEGATIVE
Nitrite: NEGATIVE
PH: 5 (ref 5.0–8.0)
Protein, ur: 30 mg/dL — AB
Specific Gravity, Urine: 1.015 (ref 1.005–1.030)
Urobilinogen, UA: 0.2 mg/dL (ref 0.0–1.0)

## 2014-12-20 LAB — URINE MICROSCOPIC-ADD ON

## 2014-12-20 NOTE — ED Notes (Signed)
Pt incontinent of stool.  Pericare done and patient given new brief.    Upon removal of soiled brief, catheter was noted to be out and balloon deflated.  Patient has small thin blood clots at tip of urethra and in brief.  Pt c/o pain to lower abdomen.  Minimal urinary output noted in leg bag.

## 2014-12-20 NOTE — Discharge Instructions (Signed)
Foley catheter is now draining. Please try not to dislodge catheter

## 2014-12-20 NOTE — ED Notes (Signed)
Patient waiting on PTAR. 

## 2014-12-20 NOTE — ED Provider Notes (Signed)
CSN: [id number]     Arrival date & time 12/20/14  1405 History   First MD Initiated Contact with Patient 12/20/14 1507     Chief Complaint  Patient presents with  . Urinary Retention    Possible blockage to catheter.     (Consider location/radiation/quality/duration/timing/severity/associated sxs/prior Treatment) HPI.....Marland Kitchen level V caveat for dementia. Apparently patient has pulled out his Foley catheter. There was a small amount of blood at the meatus. No fever, sweats, chills, flank pain, abdominal pain. Past Medical History  Diagnosis Date  . Hypertension   . Diabetes mellitus   . Anxiety   . Renal disorder   . Parkinson disease   . Lumbar radiculopathy   . Osteopenia   . CHF (congestive heart failure)    Past Surgical History  Procedure Laterality Date  . Rotator cuff repair    . Left toenail removed    . Cholecystectomy     Family History  Problem Relation Age of Onset  . Heart attack Father   . Parkinson's disease Mother    Social History  Substance Use Topics  . Smoking status: Former Smoker -- 1.00 packs/day for 35 years    Types: Cigarettes    Quit date: 03/06/2008  . Smokeless tobacco: Never Used  . Alcohol Use: No    Review of Systems  Unable to perform ROS: Dementia      Allergies  Review of patient's allergies indicates no known allergies.  Home Medications   Prior to Admission medications   Medication Sig Start Date End Date Taking? Authorizing Provider  atorvastatin (LIPITOR) 80 MG tablet Take 1 tablet (80 mg total) by mouth daily at 6 PM. 08/28/14  Yes Calvert Cantor, MD  carbidopa-levodopa (SINEMET) 25-250 MG per tablet Take 1 tablet by mouth 3 (three) times daily.     Yes Historical Provider, MD  cephALEXin (KEFLEX) 500 MG capsule Take 1 capsule (500 mg total) by mouth 2 (two) times daily. Patient taking differently: Take 500 mg by mouth 2 (two) times daily. Started 09/11 for 7 days 12/15/14  Yes [name], MD  doxazosin (CARDURA) 8 MG  tablet Take 8 mg by mouth at bedtime.     Yes Historical Provider, MD  furosemide (LASIX) 20 MG tablet Take 20 mg by mouth daily.   Yes Historical Provider, MD  hydrALAZINE (APRESOLINE) 25 MG tablet Take 1 tablet (25 mg total) by mouth every 8 (eight) hours. 08/28/14  Yes Calvert Cantor, MD  insulin aspart (NOVOLOG) 100 UNIT/ML injection Inject 0-20 Units into the skin 3 (three) times daily with meals. Patient taking differently: Inject 6 Units into the skin 3 (three) times daily with meals.  08/28/14  Yes Calvert Cantor, MD  LANTUS 100 UNIT/ML injection Inject 0.2 mLs (20 Units total) into the skin at bedtime. Patient taking differently: Inject 10 Units into the skin at bedtime.  08/28/14  Yes Calvert Cantor, MD  metoprolol (LOPRESSOR) 50 MG tablet Take 1 tablet (50 mg total) by mouth 2 (two) times daily. 08/28/14  Yes Calvert Cantor, MD  morphine (ROXANOL) 20 MG/ML concentrated solution Take 5 mg by mouth every 4 (four) hours as needed for severe pain.   Yes Historical Provider, MD  nitroGLYCERIN (NITROSTAT) 0.4 MG SL tablet Place 0.4 mg under the tongue every 5 (five) minutes as needed for chest pain.   Yes Historical Provider, MD  sertraline (ZOLOFT) 50 MG tablet Take 50 mg by mouth daily. 08/06/14  Yes Historical Provider, MD  UNABLE TO FIND Take 120  mLs by mouth 2 (two) times daily. Med Name: Magic Cup   Yes Historical Provider, MD  Vitamin D, Ergocalciferol, (DRISDOL) 50000 UNITS CAPS Take 50,000 Units by mouth every 30 (thirty) days.    Yes Historical Provider, MD  amiodarone (PACERONE) 400 MG tablet Take 1 tablet (400 mg total) by mouth 2 (two) times daily. Patient not taking: Reported on 12/15/2014 08/28/14   Calvert Cantor, MD  isosorbide mononitrate (IMDUR) 30 MG 24 hr tablet Take 1 tablet (30 mg total) by mouth daily. Patient not taking: Reported on 12/15/2014 08/28/14   Calvert Cantor, MD  QUEtiapine (SEROQUEL) 25 MG tablet Take 1 tablet (25 mg total) by mouth at bedtime. Patient not taking: Reported on  12/15/2014 08/28/14   Calvert Cantor, MD   BP 118/48 mmHg  Pulse 58  Temp(Src) 98.6 F (37 C) (Oral)  Resp 17  SpO2 100% Physical Exam  Constitutional:  demented  HENT:  Head: Normocephalic and atraumatic.  Eyes: Conjunctivae and EOM are normal. Pupils are equal, round, and reactive to light.  Neck: Normal range of motion. Neck supple.  Cardiovascular: Normal rate and regular rhythm.   Pulmonary/Chest: Effort normal and breath sounds normal.  Abdominal: Soft. Bowel sounds are normal.  nontender  Musculoskeletal: Normal range of motion.  Neurological: He is alert.  Skin: Skin is warm and dry.  Psychiatric:  demented  Nursing note and vitals reviewed.   ED Course  Procedures (including critical care time) Labs Review Labs Reviewed  URINALYSIS, ROUTINE W REFLEX MICROSCOPIC (NOT AT [facility]) - Abnormal; Notable for the following:    Hgb urine dipstick MODERATE (*)    Protein, ur 30 (*)    All other components within normal limits  URINE MICROSCOPIC-ADD ON    Imaging Review No results found. I have personally reviewed and evaluated these images and lab results as part of my medical decision-making.   EKG Interpretation None      MDM   Final diagnoses:  Complication of Foley catheter, initial encounter    Foley catheter replaced. No evidence of UTI.  Findings discussed with patient's daughter.    Donnetta Hutching, MD 12/20/14 1902

## 2014-12-20 NOTE — ED Notes (Signed)
Pt transported by [facility] from [facility].  Pt has possible blockage of urinary catheter.  Pt has had no output x 16 hours.  Pt was seen recently at [facility] ER for same.  No other complaints.

## 2014-12-20 NOTE — ED Notes (Signed)
Bed: PY05 Expected date:  Expected time:  Means of arrival:  Comments: Ems-82ish M cath issues

## 2015-09-05 DEATH — deceased

## 2016-02-24 IMAGING — RF DG SWALLOWING FUNCTION - NRPT [facility]
1 series · 18 of 24 positions shown · non-contrast
Comparison: none

[Series 1: run · 16 acquisitions, 18 frames shown]
[im 1/16]
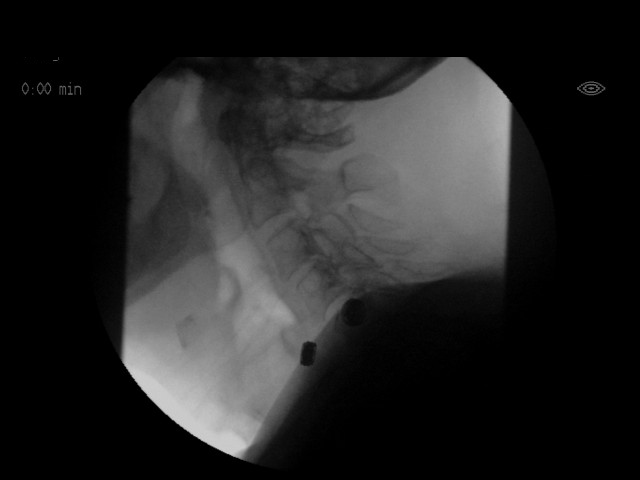
[im 2/16]
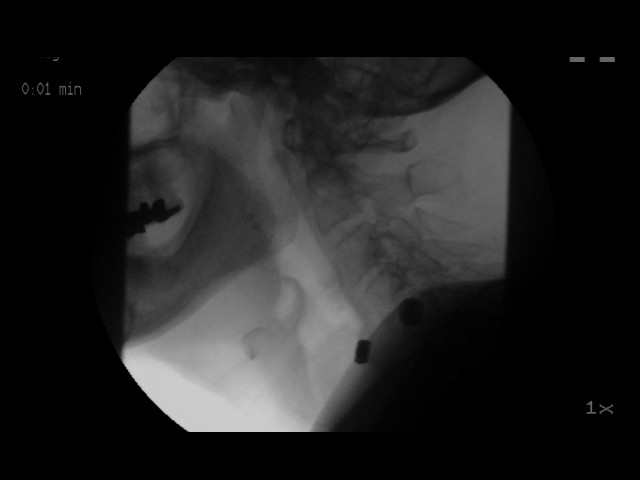
[im 3/16]
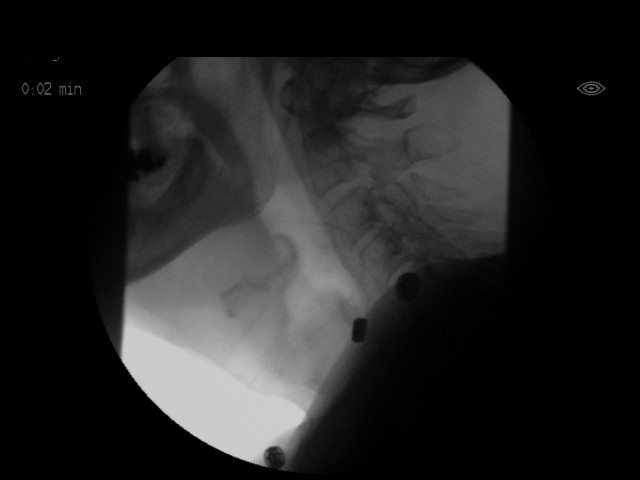
[im 3/16]
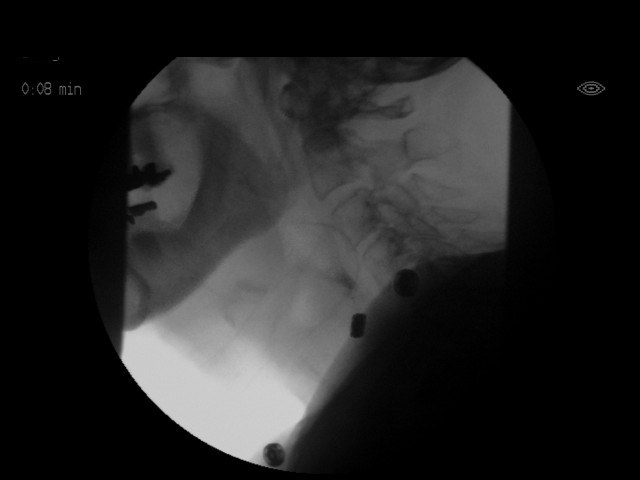
[im 5/16]
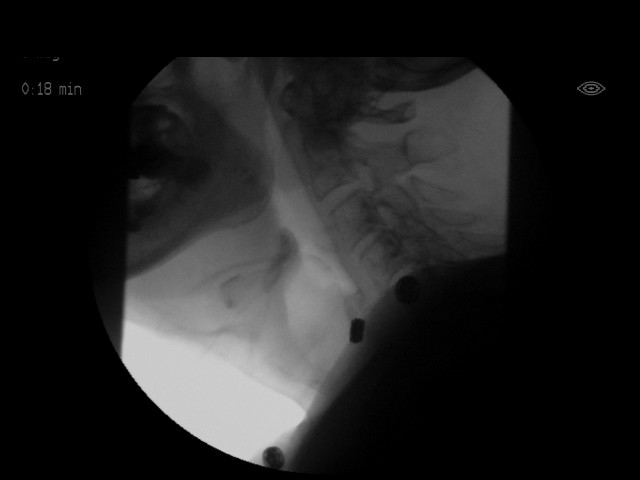
[im 5/16]
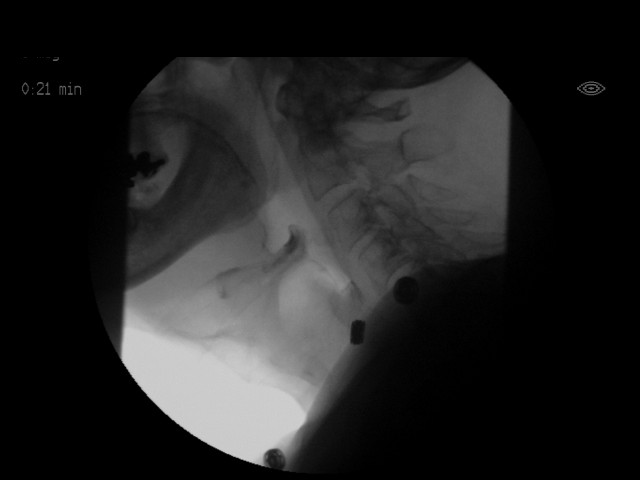
[im 6/16]
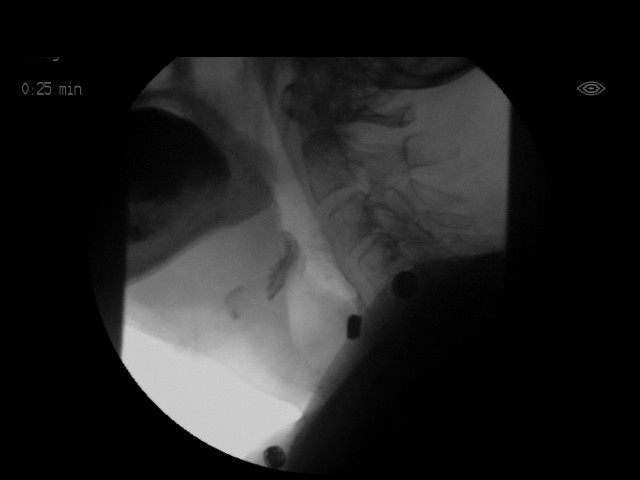
[im 7/16]
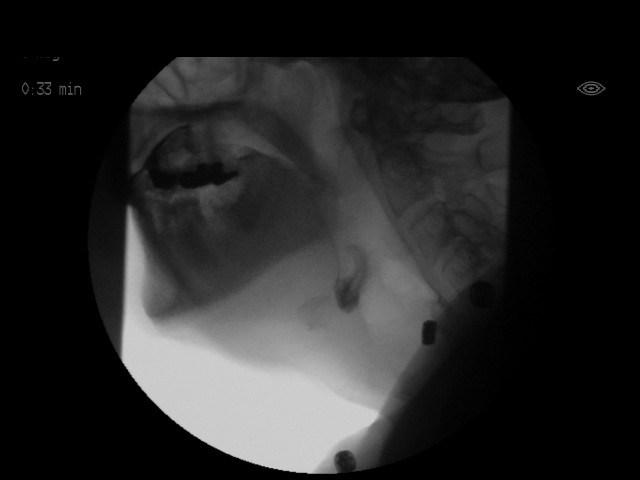
[im 8/16]
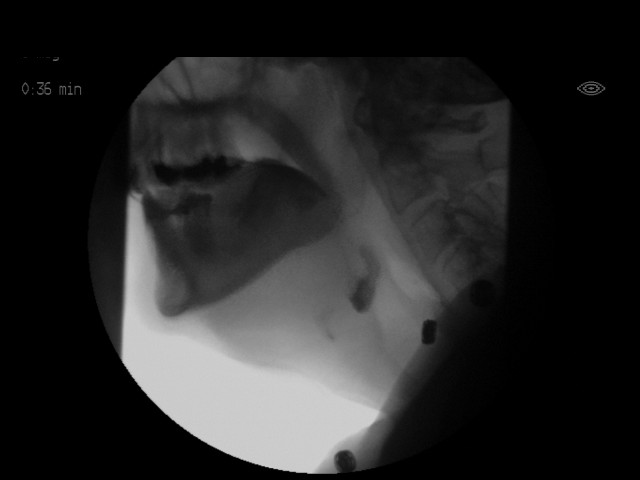
[im 9/16]
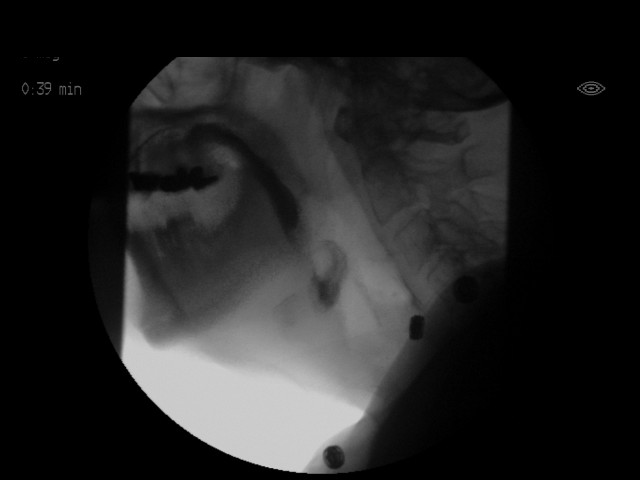
[im 10/16]
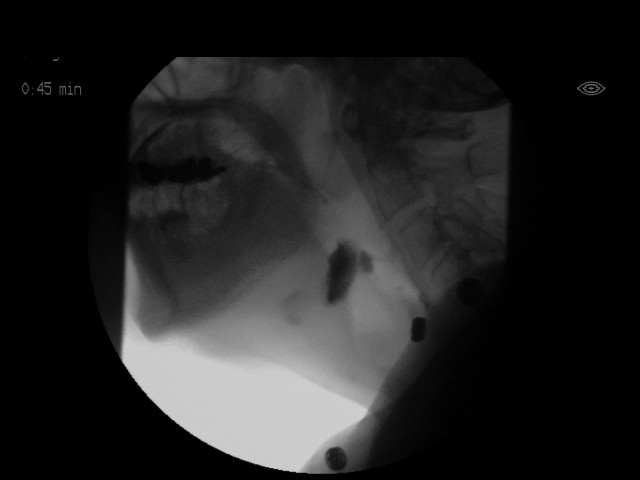
[im 11/16]
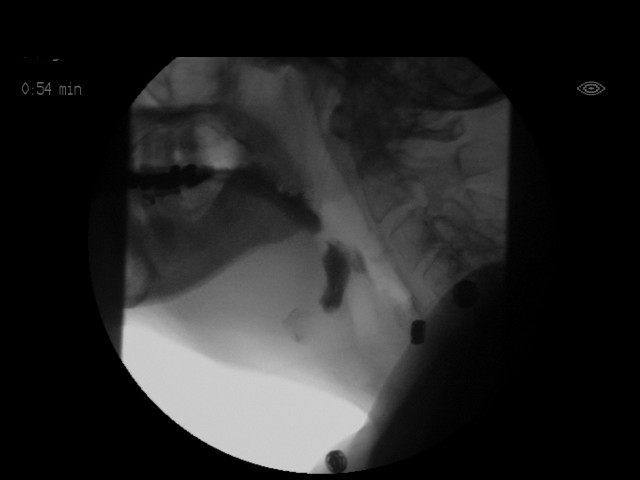
[im 12/16]
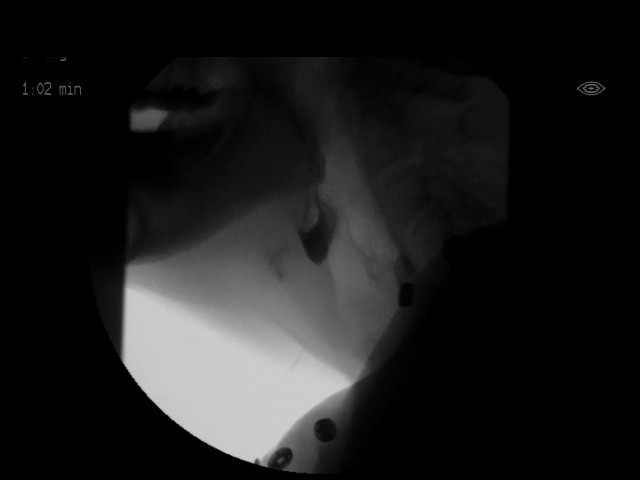
[im 13/16]
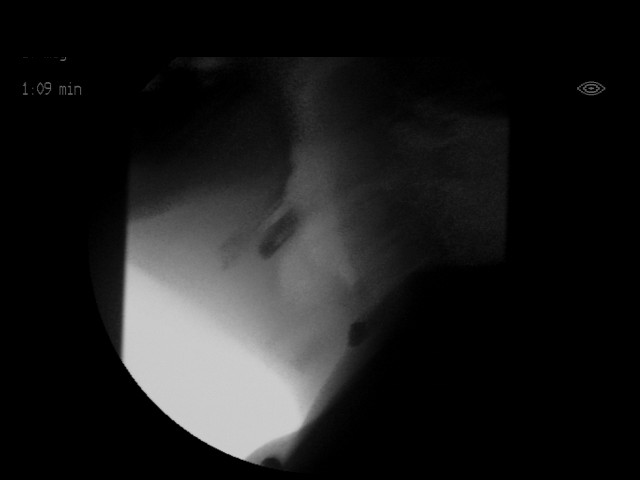
[im 14/16]
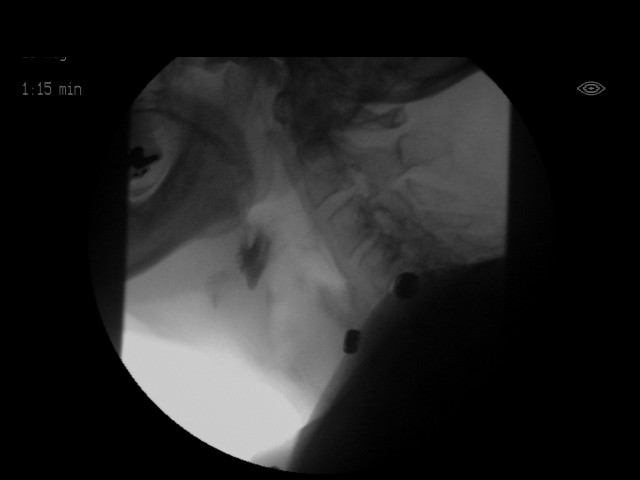
[im 14/16]
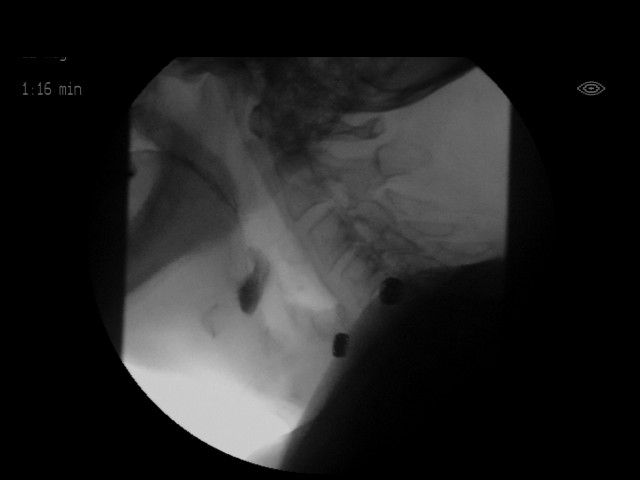
[im 16/16]
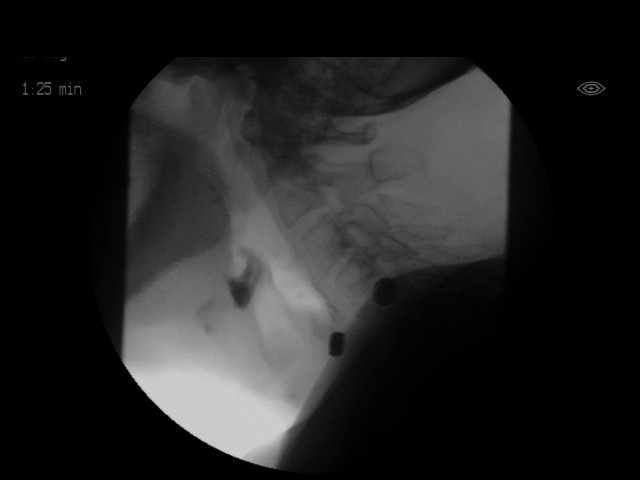
[im 16/16]
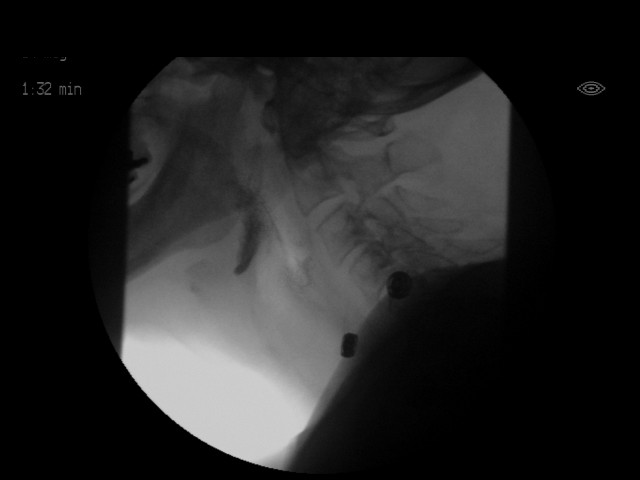

[18 of 24 positions shown; findings below may reference images not displayed]

Canned report from images found in remote index.

Refer to host system for actual result text.

## 2016-06-16 IMAGING — CT CT ABD-PELV W/O CM
2 of 4 series · 15 of 46 positions shown, 17 images · non-contrast
Comparison: 07/05/2008

CLINICAL DATA: Left flank pain into the low back. Gross hematuria.
Unable to urinate for 1 day. Foley in place.

EXAM:
CT ABDOMEN AND PELVIS WITHOUT CONTRAST
TECHNIQUE: Multidetector CT imaging of the abdomen and pelvis was performed
following the standard protocol without IV contrast.

[Series 2: abd/ pelvis 5.0 i30f 1 · axial · 0.93mm/px · z∈[+768,+1198]mm · 12 of 102 slices shown, 14 images]
[im 8/102  soft-tissue]
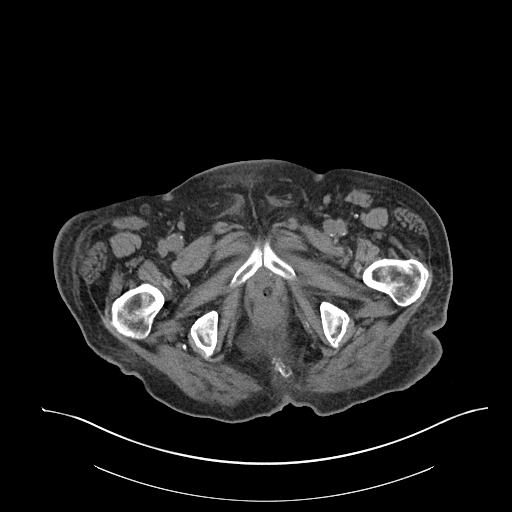
[im 8/102  bone]
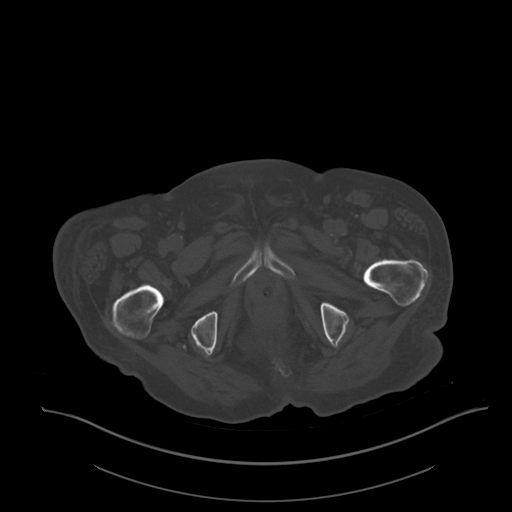
[im 16/102  soft-tissue]
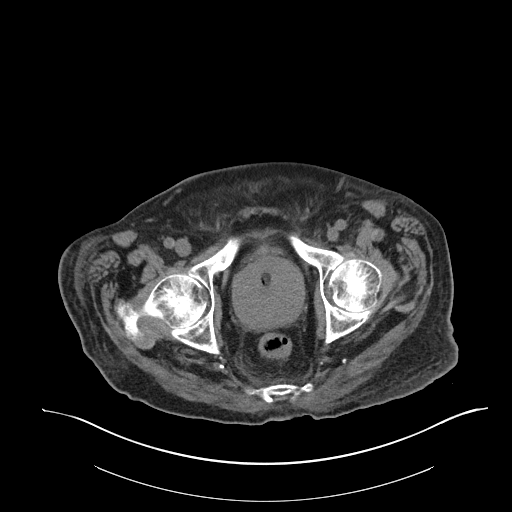
[im 24/102  soft-tissue]
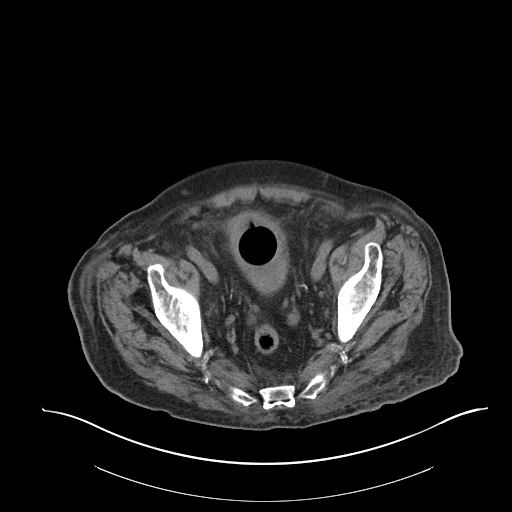
[im 32/102  soft-tissue]
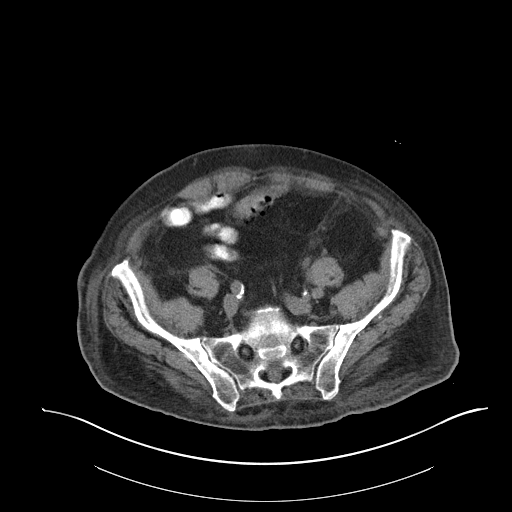
[im 39/102  soft-tissue]
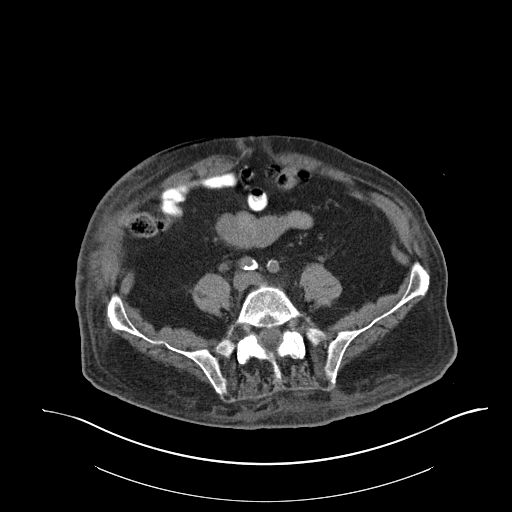
[im 47/102  soft-tissue]
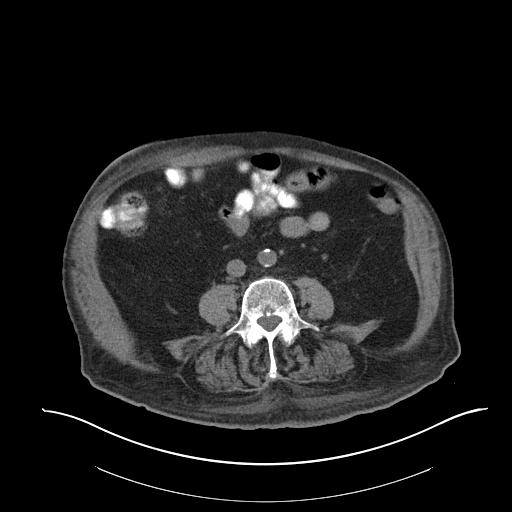
[im 55/102  soft-tissue]
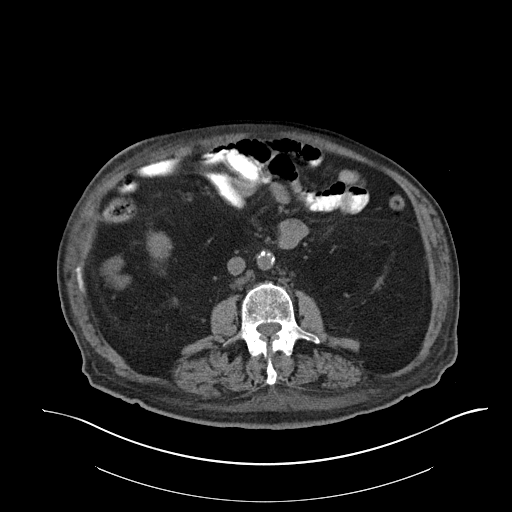
[im 63/102  soft-tissue]
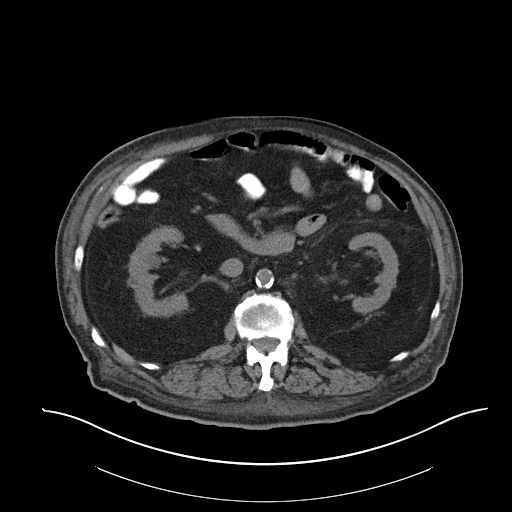
[im 70/102  soft-tissue]
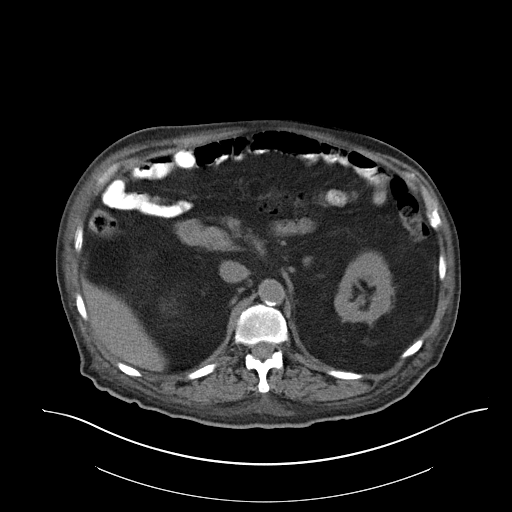
[im 70/102  bone]
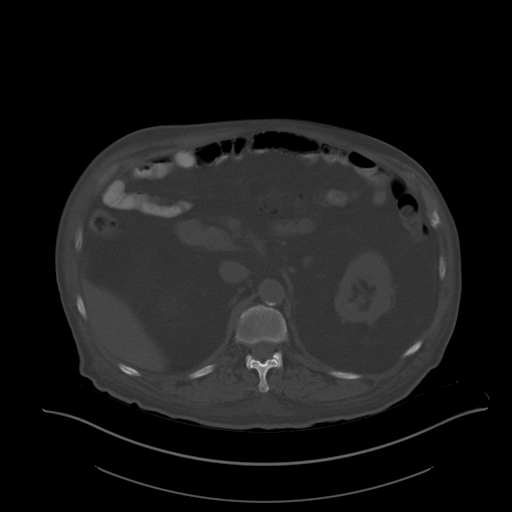
[im 78/102  soft-tissue]
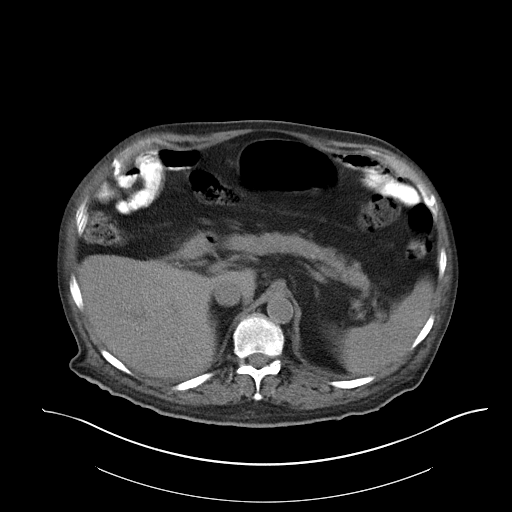
[im 86/102  soft-tissue]
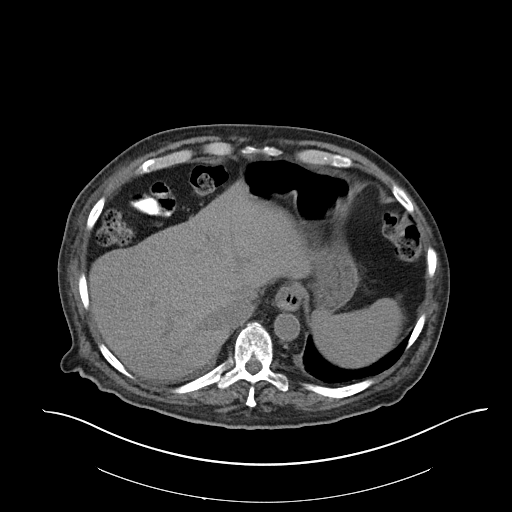
[im 94/102  soft-tissue]
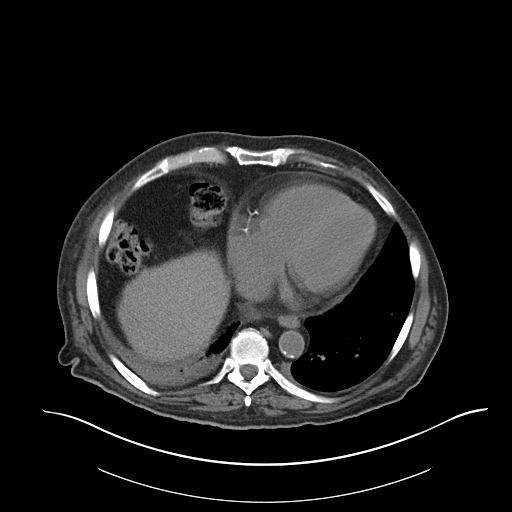

[Series 5: cor st · coronal · 0.84mm/px · 3 of 93 slices shown]
[im 31/93  soft-tissue]
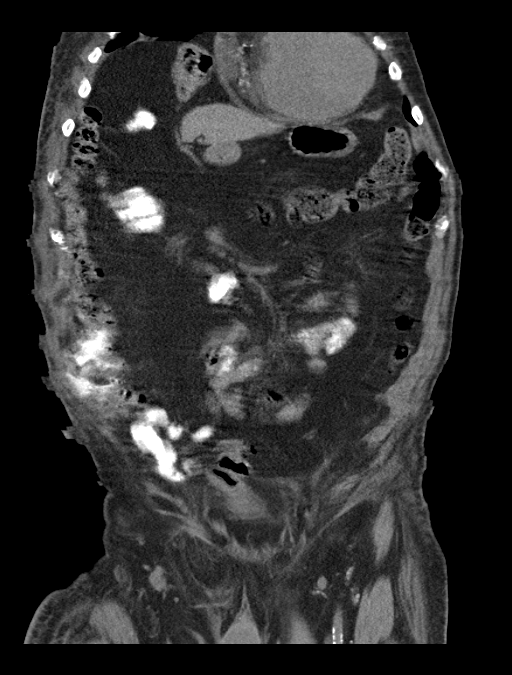
[im 41/93  soft-tissue]
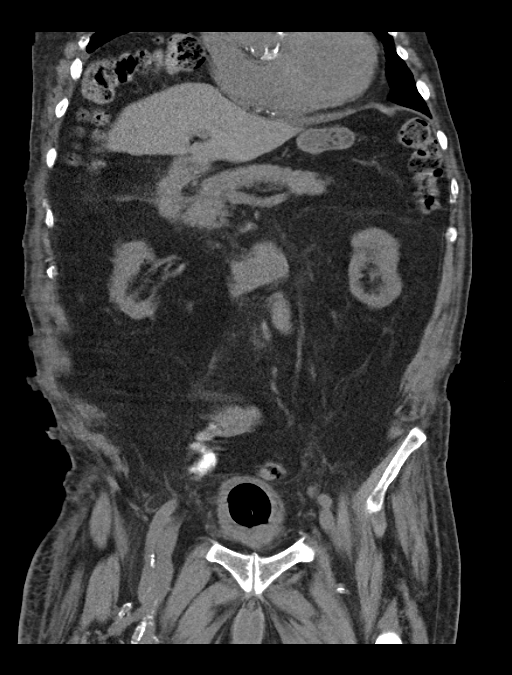
[im 52/93  soft-tissue]
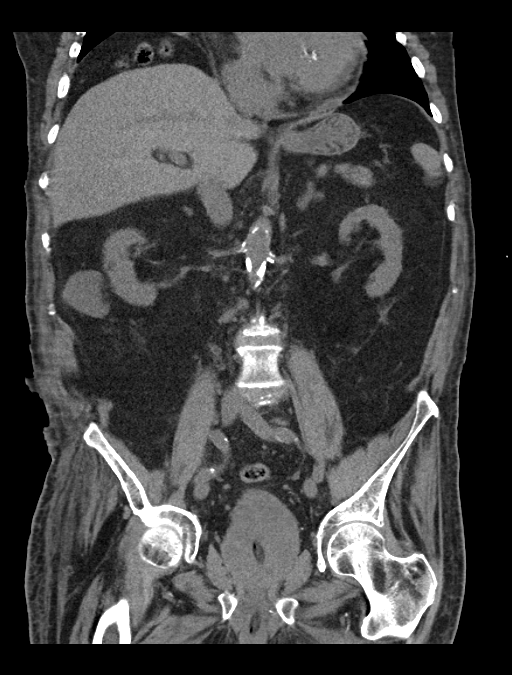

[15 of 46 positions shown; findings below may reference images not displayed]

FINDINGS: Examination is technically limited due to motion artifact. Small
right pleural effusion with basilar atelectasis. Cardiac
enlargement. Small pericardial effusion. Small esophageal hiatal
hernia.

Gallbladder is surgically absent. No bile duct dilatation.
Unenhanced appearance of the liver, spleen, pancreas, adrenal
glands, inferior vena cava, is unremarkable. Mild prominence of
retroperitoneal lymph nodes, largest measuring about 12 mm diameter.
Appearance is similar to prior study, probably representing reactive
nodes. 3.8 cm diameter cyst arising from the midpole right kidney.
No significant change since previous study. Tiny calcification
demonstrated in the midpole left kidney is probably a vascular
calcification. No hydronephrosis or hydroureter. No renal, ureteral,
or bladder stones. Bladder is decompressed with a Foley catheter in
place. Stomach, small bowel, and colon are not abnormally distended.
Contrast material flows through to the colon without evidence of
small bowel obstruction. No free air or free fluid in the abdomen.
Small umbilical hernia containing fat.

Pelvis: Marked enlargement of the prostate gland at 6.8 x 6.8 cm
diameter. Size is increased since the previous study. This may
contribute to bladder outlet obstruction. There is infiltration in
the pelvic fat bilaterally but most prominent on the left. This is
nonspecific and may represent inflammatory process. Scattered
diverticula in the sigmoid colon without evidence of diverticulitis.
Appendix is normal. No free or loculated pelvic fluid collections.
No pelvic mass or lymphadenopathy. Fat in the inguinal canals
bilaterally. Degenerative changes in the spine and hips. No
destructive bone lesions identified. Compression of the superior
endplate of L2. Slight anterior subluxation of L4 on L5. These
changes are new since the previous study but age indeterminate.
IMPRESSION: No renal or ureteral stone or obstruction. Bladder is decompressed
with a Foley catheter. Prominent enlargement of the prostate gland
possibly contributing to bladder outlet obstruction. Nonspecific
infiltration in the pelvic fat. No evidence of diverticulitis. Small
right pleural effusion with basilar atelectasis. Small pericardial
effusion.
